# Patient Record
Sex: Female | Born: 1985
Health system: Southern US, Community
[De-identification: ages and names within clinical notes are randomized; demographics above are authoritative.]

## PROBLEM LIST (undated history)

## (undated) ENCOUNTER — Inpatient Hospital Stay (HOSPITAL_COMMUNITY): Payer: Self-pay

## (undated) HISTORY — PX: BREAST SURGERY: SHX581

---

## 2006-10-02 HISTORY — PX: WISDOM TOOTH EXTRACTION: SHX21

## 2013-11-21 ENCOUNTER — Emergency Department (HOSPITAL_COMMUNITY)
Admission: EM | Admit: 2013-11-21 | Discharge: 2013-11-21 | Disposition: A | Discharge status: Home / Self Care | Payer: 59 | Attending: Emergency Medicine | Admitting: Emergency Medicine

## 2013-11-21 ENCOUNTER — Encounter (HOSPITAL_COMMUNITY): Payer: Self-pay | Admitting: Emergency Medicine

## 2013-11-21 DIAGNOSIS — R209 Unspecified disturbances of skin sensation: Secondary | ICD-10-CM | POA: Insufficient documentation

## 2013-11-21 DIAGNOSIS — R1084 Generalized abdominal pain: Secondary | ICD-10-CM | POA: Insufficient documentation

## 2013-11-21 DIAGNOSIS — F458 Other somatoform disorders: Secondary | ICD-10-CM

## 2013-11-21 DIAGNOSIS — R112 Nausea with vomiting, unspecified: Secondary | ICD-10-CM | POA: Insufficient documentation

## 2013-11-21 DIAGNOSIS — Z3202 Encounter for pregnancy test, result negative: Secondary | ICD-10-CM | POA: Insufficient documentation

## 2013-11-21 DIAGNOSIS — R109 Unspecified abdominal pain: Secondary | ICD-10-CM

## 2013-11-21 DIAGNOSIS — M62838 Other muscle spasm: Secondary | ICD-10-CM | POA: Insufficient documentation

## 2013-11-21 DIAGNOSIS — G518 Other disorders of facial nerve: Secondary | ICD-10-CM | POA: Insufficient documentation

## 2013-11-21 DIAGNOSIS — F411 Generalized anxiety disorder: Secondary | ICD-10-CM | POA: Insufficient documentation

## 2013-11-21 DIAGNOSIS — R1110 Vomiting, unspecified: Secondary | ICD-10-CM

## 2013-11-21 LAB — COMPREHENSIVE METABOLIC PANEL
ALT: 16 U/L (ref 0–35)
AST: 18 U/L (ref 0–37)
Albumin: 3.7 g/dL (ref 3.5–5.2)
Alkaline Phosphatase: 46 U/L (ref 39–117)
BUN: 10 mg/dL (ref 6–23)
CALCIUM: 9.1 mg/dL (ref 8.4–10.5)
CO2: 17 mEq/L — ABNORMAL LOW (ref 19–32)
Chloride: 99 mEq/L (ref 96–112)
Creatinine, Ser: 0.74 mg/dL (ref 0.50–1.10)
GFR calc Af Amer: >90 mL/min (ref >90)
GFR calc non Af Amer: >90 mL/min (ref >90)
Glucose, Bld: 128 mg/dL — ABNORMAL HIGH (ref 70–99)
Potassium: 3.6 mEq/L — ABNORMAL LOW (ref 3.7–5.3)
Sodium: 137 mEq/L (ref 137–147)
TOTAL PROTEIN: 7.2 g/dL (ref 6.0–8.3)
Total Bilirubin: 1.1 mg/dL (ref 0.3–1.2)

## 2013-11-21 LAB — CBC WITH DIFFERENTIAL/PLATELET
BASOS ABS: 0 10*3/uL (ref 0.0–0.1)
BASOS PCT: 0 % (ref 0–1)
EOS ABS: 3.6 10*3/uL — AB (ref 0.0–0.7)
Eosinophils Relative: 26 % — ABNORMAL HIGH (ref 0–5)
HEMATOCRIT: 38.8 % (ref 36.0–46.0)
HEMOGLOBIN: 13.7 g/dL (ref 12.0–15.0)
Lymphocytes Relative: 30 % (ref 12–46)
Lymphs Abs: 4.2 10*3/uL — ABNORMAL HIGH (ref 0.7–4.0)
MCH: 31.7 pg (ref 26.0–34.0)
MCHC: 35.3 g/dL (ref 30.0–36.0)
MCV: 89.8 fL (ref 78.0–100.0)
MONO ABS: 0.7 10*3/uL (ref 0.1–1.0)
Monocytes Relative: 5 % (ref 3–12)
NEUTROS ABS: 5.4 10*3/uL (ref 1.7–7.7)
Neutrophils Relative %: 39 % — ABNORMAL LOW (ref 43–77)
Platelets: 370 10*3/uL (ref 150–400)
RBC: 4.32 MIL/uL (ref 3.87–5.11)
RDW: 12.9 % (ref 11.5–15.5)
WBC: 13.9 10*3/uL — ABNORMAL HIGH (ref 4.0–10.5)

## 2013-11-21 LAB — URINALYSIS, ROUTINE W REFLEX MICROSCOPIC
BILIRUBIN URINE: NEGATIVE
Glucose, UA: NEGATIVE mg/dL
Ketones, ur: 40 mg/dL — AB
LEUKOCYTES UA: NEGATIVE
NITRITE: NEGATIVE
Protein, ur: NEGATIVE mg/dL
SPECIFIC GRAVITY, URINE: 1.006 (ref 1.005–1.030)
UROBILINOGEN UA: 0.2 mg/dL (ref 0.0–1.0)
pH: 7.5 (ref 5.0–8.0)

## 2013-11-21 LAB — URINE MICROSCOPIC-ADD ON

## 2013-11-21 LAB — POC URINE PREG, ED: PREG TEST UR: NEGATIVE

## 2013-11-21 LAB — LIPASE, BLOOD: LIPASE: 40 U/L (ref 11–59)

## 2013-11-21 MED ORDER — SODIUM CHLORIDE 0.9 % IV BOLUS (SEPSIS)
1000.0000 mL | Freq: Once | INTRAVENOUS | Status: AC
  Administered 2013-11-21: 1000 mL via INTRAVENOUS

## 2013-11-21 MED ORDER — ONDANSETRON HCL 4 MG PO TABS: 4.0000 mg | ORAL_TABLET | Freq: Four times a day (QID) | ORAL | Status: DC

## 2013-11-21 MED ORDER — ONDANSETRON HCL 4 MG/2ML IJ SOLN
4.0000 mg | Freq: Once | INTRAMUSCULAR | Status: AC
  Administered 2013-11-21: 4 mg via INTRAVENOUS
  Filled 2013-11-21: qty 2

## 2013-11-21 MED ORDER — LORAZEPAM 2 MG/ML IJ SOLN
1.0000 mg | Freq: Once | INTRAMUSCULAR | Status: AC
  Administered 2013-11-21: 1 mg via INTRAVENOUS
  Filled 2013-11-21: qty 1

## 2013-11-21 NOTE — Discharge Instructions (Signed)
Abdominal Pain, Adult Many things can cause abdominal pain. Usually, abdominal pain is not caused by a disease and will improve without treatment. It can often be observed and treated at home. Your health care provider will do a physical exam and possibly order blood tests and X-rays to help determine the seriousness of your pain. However, in many cases, more time must pass before a clear cause of the pain can be found. Before that point, your health care provider may not know if you need more testing or further treatment. HOME CARE INSTRUCTIONS  Monitor your abdominal pain for any changes. The following actions may help to alleviate any discomfort you are experiencing:  Only take over-the-counter or prescription medicines as directed by your health care provider.  Do not take laxatives unless directed to do so by your health care provider.  Try a clear liquid diet (broth, tea, or water) as directed by your health care provider. Slowly move to a bland diet as tolerated. SEEK MEDICAL CARE IF:  You have unexplained abdominal pain.  You have abdominal pain associated with nausea or diarrhea.  You have pain when you urinate or have a bowel movement.  You experience abdominal pain that wakes you in the night.  You have abdominal pain that is worsened or improved by eating food.  You have abdominal pain that is worsened with eating fatty foods. SEEK IMMEDIATE MEDICAL CARE IF:   Your pain does not go away within 2 hours.  You have a fever.  You keep throwing up (vomiting).  Your pain is felt only in portions of the abdomen, such as the right side or the left lower portion of the abdomen.  You pass bloody or black tarry stools. MAKE SURE YOU:  Understand these instructions.   Will watch your condition.   Will get help right away if you are not doing well or get worse.  Document Released: 06/28/2005 Document Revised: 07/09/2013 Document Reviewed: 05/28/2013 [facility] Patient  Information 2014 [place].  Nausea and Vomiting Nausea is a sick feeling that often comes before throwing up (vomiting). Vomiting is a reflex where stomach contents come out of your mouth. Vomiting can cause severe loss of body fluids (dehydration). Children and elderly adults can become dehydrated quickly, especially if they also have diarrhea. Nausea and vomiting are symptoms of a condition or disease. It is important to find the cause of your symptoms. CAUSES   Direct irritation of the stomach lining. This irritation can result from increased acid production (gastroesophageal reflux disease), infection, food poisoning, taking certain medicines (such as nonsteroidal anti-inflammatory drugs), alcohol use, or tobacco use.  Signals from the brain.These signals could be caused by a headache, heat exposure, an inner ear disturbance, increased pressure in the brain from injury, infection, a tumor, or a concussion, pain, emotional stimulus, or metabolic problems.  An obstruction in the gastrointestinal tract (bowel obstruction).  Illnesses such as diabetes, hepatitis, gallbladder problems, appendicitis, kidney problems, cancer, sepsis, atypical symptoms of a heart attack, or eating disorders.  Medical treatments such as chemotherapy and radiation.  Receiving medicine that makes you sleep (general anesthetic) during surgery. DIAGNOSIS Your caregiver may ask for tests to be done if the problems do not improve after a few days. Tests may also be done if symptoms are severe or if the reason for the nausea and vomiting is not clear. Tests may include:  Urine tests.  Blood tests.  Stool tests.  Cultures (to look for evidence of infection).  X-rays or  other imaging studies. Test results can help your caregiver make decisions about treatment or the need for additional tests. TREATMENT You need to stay well hydrated. Drink frequently but in small amounts.You may wish to drink water, sports  drinks, clear broth, or eat frozen ice pops or gelatin dessert to help stay hydrated.When you eat, eating slowly may help prevent nausea.There are also some antinausea medicines that may help prevent nausea. HOME CARE INSTRUCTIONS   Take all medicine as directed by your caregiver.  If you do not have an appetite, do not force yourself to eat. However, you must continue to drink fluids.  If you have an appetite, eat a normal diet unless your caregiver tells you differently.  Eat a variety of complex carbohydrates (rice, wheat, potatoes, bread), lean meats, yogurt, fruits, and vegetables.  Avoid high-fat foods because they are more difficult to digest.  Drink enough water and fluids to keep your urine clear or pale yellow.  If you are dehydrated, ask your caregiver for specific rehydration instructions. Signs of dehydration may include:  Severe thirst.  Dry lips and mouth.  Dizziness.  Dark urine.  Decreasing urine frequency and amount.  Confusion.  Rapid breathing or pulse. SEEK IMMEDIATE MEDICAL CARE IF:   You have blood or brown flecks (like coffee grounds) in your vomit.  You have black or bloody stools.  You have a severe headache or stiff neck.  You are confused.  You have severe abdominal pain.  You have chest pain or trouble breathing.  You do not urinate at least once every 8 hours.  You develop cold or clammy skin.  You continue to vomit for longer than 24 to 48 hours.  You have a fever. MAKE SURE YOU:   Understand these instructions.  Will watch your condition.  Will get help right away if you are not doing well or get worse. Document Released: 09/18/2005 Document Revised: 12/11/2011 Document Reviewed: 02/15/2011 [facility] Patient Information 2014 [place], [place].  Hyperventilation Hyperventilation is breathing that is deeper and more rapid than normal. It is usually associated with panic and anxiety. Hyperventilation can make you feel  breathless. It is sometimes called overbreathing. Breathing out too much causes a decrease in the amount of carbon dioxide gas in the blood. This leads to tingling and numbness in the hands, feet, and around the mouth. If this continues, your fingers, hands, and toes may begin to spasm. Hyperventilation usually lasts 20 30 minutes and can be associated with other symptoms of panic and anxiety, including:   Chest pains or tightness.  A pounding or irregular, racing heartbeat (palpitations).  Dizziness.  Lightheadedness.  Dry mouth.  Weakness.  Confusion.  Sleep disturbance. CAUSES  Sudden onset (acute) hyperventilation is usually triggered by acute stress, anxiety, or emotional upset. Long-term (chronic) and recurring hyperventilation can occur with chronic lung problems, such emphysema or asthma. Other causes include:   Nervousness.  Stress.  Stimulant, drug, or alcohol use.  Lung disease.  Infections, such as pneumonia.  Heart problems.  Severe pain.  Waking from a bad dream.  Pregnancy.  Bleeding. HOME CARE INSTRUCTIONS  Learn and use breathing exercises that help you breathe from your diaphragm and abdomen.  Practice relaxation techniques to reduce stress, such as visualization, meditation, and muscle release.  During an attack, try breathing into a paper bag. This changes the carbon dioxide level and slows down breathing. SEEK IMMEDIATE MEDICAL CARE IF:  Your hyperventilation continues or gets worse. MAKE SURE YOU:  Understand these instructions.  Will watch your condition.  Will get help right away if you are not doing well or get worse. Document Released: 09/15/2000 Document Revised: 03/19/2012 Document Reviewed: 12/28/2011 [facility] Patient Information 2014 [place].

## 2013-11-21 NOTE — ED Notes (Signed)
Patient reports vomiting last night for the first time. She came to work feeling weak and fatigued. Patient reports vomiting again this morning. Her hands have cramped and her face started to cramp also. Patient's breathing was labored and quick upon arrival.

## 2013-11-21 NOTE — ED Notes (Signed)
MD at bedside. 

## 2013-11-21 NOTE — ED Provider Notes (Signed)
CSN: [id number]     Arrival date & time 11/21/13  0716 History   First MD Initiated Contact with Patient 11/21/13 0719     Chief Complaint  Patient presents with  . Emesis     (Consider location/radiation/quality/duration/timing/severity/associated sxs/prior Treatment) HPI Comments: Patient brought to the emergency department for evaluation of vomiting. Patient reports that she had been feeling over the night, multiple episodes of vomiting. She has some abdominal cramping as well. 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 She is brought to the ER by coworkers, extremely anxious and hyperventilating.  Patient is a 28 y.o. female presenting with vomiting.  Emesis Associated symptoms: abdominal pain     No past medical history on file. No past surgical history on file. No family history on file. History  Substance Use Topics  . Smoking status: Not on file  . Smokeless tobacco: Not on file  . Alcohol Use: Not on file   OB History   No data available     Review of Systems  Gastrointestinal: Positive for nausea, vomiting and abdominal pain.  Psychiatric/Behavioral: The patient is nervous/anxious.   All other systems reviewed and are negative.      Allergies  Review of patient's allergies indicates not on file.  Home Medications  No current outpatient prescriptions on file. BP 133/72  Pulse 131  Temp(Src) 98.1 F (36.7 C) (Oral)  Resp 24  SpO2 100% Physical Exam  Constitutional: She is oriented to person, place, and time. She appears well-developed and well-nourished. No distress.  HENT:  Head: Normocephalic and atraumatic.  Right Ear: Hearing normal.  Left Ear: Hearing normal.  Nose: Nose normal.  Mouth/Throat: Oropharynx is clear and moist and mucous membranes are normal.  Facial/jaw spasm  Eyes: Conjunctivae and EOM are normal. Pupils are equal, round, and  reactive to light.  Neck: Normal range of motion. Neck supple.  Cardiovascular: Regular rhythm, S1 normal and S2 normal.  Exam reveals no gallop and no friction rub.   No murmur heard. Pulmonary/Chest: Effort normal and breath sounds normal. No respiratory distress. She exhibits no tenderness.  Abdominal: Soft. Normal appearance and bowel sounds are normal. There is no hepatosplenomegaly. There is generalized tenderness. There is no rebound, no guarding, no tenderness at McBurney's point and negative Murphy's sign. No hernia.  Musculoskeletal: Normal range of motion.  Spasm of fingers and wrists  Neurological: She is alert and oriented to person, place, and time. She has normal strength. No cranial nerve deficit or sensory deficit. Coordination normal. GCS eye subscore is 4. GCS verbal subscore is 5. GCS motor subscore is 6.  Skin: Skin is warm, dry and intact. No rash noted. No cyanosis.  Psychiatric: She has a normal mood and affect. Her speech is normal and behavior is normal. Thought content normal.    ED Course  Procedures (including critical care time) Labs Review Labs Reviewed  CBC WITH DIFFERENTIAL  COMPREHENSIVE METABOLIC PANEL  LIPASE, BLOOD  URINALYSIS, ROUTINE W REFLEX MICROSCOPIC  POC URINE PREG, ED   Imaging Review No results found.  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 She was experiencing obvious hyperventilation syndrome with spasm of the facial muscles and hands. This resolved with Ativan. Patient was administered IV fluids  for her   She has not had any further nausea or vomiting. Heart rate is improved. She was never hypotensive. laboratories all within normal limits. Patient was rechecked, abdominal exam is benign, nontender. Symptoms most consistent with viral etiology. She will be discharged with symptomatic treatment. Continue oral hydration. Return if  symptoms worsen.   Orpah Greek, MD 11/21/13 1006

## 2014-06-24 ENCOUNTER — Other Ambulatory Visit: Payer: Self-pay | Admitting: Obstetrics and Gynecology

## 2014-06-24 DIAGNOSIS — N63 Unspecified lump in unspecified breast: Secondary | ICD-10-CM

## 2014-06-30 ENCOUNTER — Ambulatory Visit
Admission: RE | Admit: 2014-06-30 | Discharge: 2014-06-30 | Disposition: A | Discharge status: Home / Self Care | Payer: 59 | Source: Ambulatory Visit | Attending: Obstetrics and Gynecology | Admitting: Obstetrics and Gynecology

## 2014-06-30 DIAGNOSIS — N63 Unspecified lump in unspecified breast: Secondary | ICD-10-CM

## 2014-07-02 ENCOUNTER — Other Ambulatory Visit: Payer: Self-pay | Admitting: Obstetrics and Gynecology

## 2014-07-02 DIAGNOSIS — N6320 Unspecified lump in the left breast, unspecified quadrant: Secondary | ICD-10-CM

## 2014-07-06 ENCOUNTER — Other Ambulatory Visit: Payer: Self-pay | Admitting: Obstetrics and Gynecology

## 2014-07-06 DIAGNOSIS — N6320 Unspecified lump in the left breast, unspecified quadrant: Secondary | ICD-10-CM

## 2014-07-10 ENCOUNTER — Ambulatory Visit
Admission: RE | Admit: 2014-07-10 | Discharge: 2014-07-10 | Disposition: A | Discharge status: Home / Self Care | Payer: 59 | Source: Ambulatory Visit | Attending: Obstetrics and Gynecology | Admitting: Obstetrics and Gynecology

## 2014-07-10 DIAGNOSIS — N6320 Unspecified lump in the left breast, unspecified quadrant: Secondary | ICD-10-CM

## 2015-08-14 LAB — OB RESULTS CONSOLE RUBELLA ANTIBODY, IGM: Rubella: IMMUNE

## 2015-08-14 LAB — OB RESULTS CONSOLE HEPATITIS B SURFACE ANTIGEN: Hepatitis B Surface Ag: NEGATIVE

## 2015-08-14 LAB — OB RESULTS CONSOLE GC/CHLAMYDIA
CHLAMYDIA, DNA PROBE: NEGATIVE
Gonorrhea: NEGATIVE

## 2015-10-08 DIAGNOSIS — Z3A18 18 weeks gestation of pregnancy: Secondary | ICD-10-CM | POA: Diagnosis not present

## 2015-10-08 DIAGNOSIS — Z3401 Encounter for supervision of normal first pregnancy, first trimester: Secondary | ICD-10-CM | POA: Diagnosis not present

## 2015-10-08 DIAGNOSIS — Z3A19 19 weeks gestation of pregnancy: Secondary | ICD-10-CM | POA: Diagnosis not present

## 2015-10-08 DIAGNOSIS — Z36 Encounter for antenatal screening of mother: Secondary | ICD-10-CM | POA: Diagnosis not present

## 2015-12-09 DIAGNOSIS — Z36 Encounter for antenatal screening of mother: Secondary | ICD-10-CM | POA: Diagnosis not present

## 2015-12-09 DIAGNOSIS — Z23 Encounter for immunization: Secondary | ICD-10-CM | POA: Diagnosis not present

## 2015-12-09 DIAGNOSIS — Z3A27 27 weeks gestation of pregnancy: Secondary | ICD-10-CM | POA: Diagnosis not present

## 2015-12-09 DIAGNOSIS — Z3402 Encounter for supervision of normal first pregnancy, second trimester: Secondary | ICD-10-CM | POA: Diagnosis not present

## 2015-12-10 LAB — OB RESULTS CONSOLE HIV ANTIBODY (ROUTINE TESTING): HIV: NONREACTIVE

## 2015-12-10 LAB — OB RESULTS CONSOLE RPR: RPR: NONREACTIVE

## 2015-12-22 DIAGNOSIS — Z3403 Encounter for supervision of normal first pregnancy, third trimester: Secondary | ICD-10-CM | POA: Diagnosis not present

## 2015-12-22 DIAGNOSIS — Z3A29 29 weeks gestation of pregnancy: Secondary | ICD-10-CM | POA: Diagnosis not present

## 2016-01-04 DIAGNOSIS — Z3403 Encounter for supervision of normal first pregnancy, third trimester: Secondary | ICD-10-CM | POA: Diagnosis not present

## 2016-01-04 DIAGNOSIS — Z3A31 31 weeks gestation of pregnancy: Secondary | ICD-10-CM | POA: Diagnosis not present

## 2016-01-19 DIAGNOSIS — Z3A33 33 weeks gestation of pregnancy: Secondary | ICD-10-CM | POA: Diagnosis not present

## 2016-01-19 DIAGNOSIS — Z3403 Encounter for supervision of normal first pregnancy, third trimester: Secondary | ICD-10-CM | POA: Diagnosis not present

## 2016-02-04 DIAGNOSIS — Z3403 Encounter for supervision of normal first pregnancy, third trimester: Secondary | ICD-10-CM | POA: Diagnosis not present

## 2016-02-04 DIAGNOSIS — Z3A35 35 weeks gestation of pregnancy: Secondary | ICD-10-CM | POA: Diagnosis not present

## 2016-02-04 DIAGNOSIS — Z36 Encounter for antenatal screening of mother: Secondary | ICD-10-CM | POA: Diagnosis not present

## 2016-02-06 LAB — OB RESULTS CONSOLE GBS: GBS: POSITIVE

## 2016-02-10 DIAGNOSIS — Z3403 Encounter for supervision of normal first pregnancy, third trimester: Secondary | ICD-10-CM | POA: Diagnosis not present

## 2016-02-10 DIAGNOSIS — Z3A36 36 weeks gestation of pregnancy: Secondary | ICD-10-CM | POA: Diagnosis not present

## 2016-03-05 ENCOUNTER — Inpatient Hospital Stay (HOSPITAL_COMMUNITY)
Admission: AD | Admit: 2016-03-05 | Discharge: 2016-03-09 | DRG: 766 | Disposition: A | Discharge status: Home / Self Care | Payer: 59 | Source: Ambulatory Visit | Attending: Obstetrics and Gynecology | Admitting: Obstetrics and Gynecology

## 2016-03-05 ENCOUNTER — Encounter (HOSPITAL_COMMUNITY): Payer: Self-pay

## 2016-03-05 ENCOUNTER — Inpatient Hospital Stay (HOSPITAL_COMMUNITY): Payer: 59 | Admitting: Anesthesiology

## 2016-03-05 DIAGNOSIS — O324XX0 Maternal care for high head at term, not applicable or unspecified: Principal | ICD-10-CM | POA: Diagnosis present

## 2016-03-05 DIAGNOSIS — O99824 Streptococcus B carrier state complicating childbirth: Secondary | ICD-10-CM | POA: Diagnosis present

## 2016-03-05 DIAGNOSIS — Z3A39 39 weeks gestation of pregnancy: Secondary | ICD-10-CM | POA: Diagnosis not present

## 2016-03-05 DIAGNOSIS — Z3A40 40 weeks gestation of pregnancy: Secondary | ICD-10-CM | POA: Diagnosis not present

## 2016-03-05 LAB — CBC
HCT: 36.4 % (ref 36.0–46.0)
HEMOGLOBIN: 12.4 g/dL (ref 12.0–15.0)
MCH: 29.6 pg (ref 26.0–34.0)
MCHC: 34.1 g/dL (ref 30.0–36.0)
MCV: 86.9 fL (ref 78.0–100.0)
Platelets: 356 10*3/uL (ref 150–400)
RBC: 4.19 MIL/uL (ref 3.87–5.11)
RDW: 14.3 % (ref 11.5–15.5)
WBC: 14.6 10*3/uL — ABNORMAL HIGH (ref 4.0–10.5)

## 2016-03-05 LAB — TYPE AND SCREEN
ABO/RH(D): O POS
Antibody Screen: NEGATIVE

## 2016-03-05 LAB — RPR: RPR: NONREACTIVE

## 2016-03-05 LAB — ABO/RH: ABO/RH(D): O POS

## 2016-03-05 MED ORDER — PHENYLEPHRINE 40 MCG/ML (10ML) SYRINGE FOR IV PUSH (FOR BLOOD PRESSURE SUPPORT): 80.0000 ug | PREFILLED_SYRINGE | INTRAVENOUS | Status: DC | PRN

## 2016-03-05 MED ORDER — LACTATED RINGERS IV SOLN
500.0000 mL | INTRAVENOUS | Status: DC | PRN
  Administered 2016-03-05: 500 mL via INTRAVENOUS

## 2016-03-05 MED ORDER — FLEET ENEMA 7-19 GM/118ML RE ENEM: 1.0000 | ENEMA | RECTAL | Status: DC | PRN

## 2016-03-05 MED ORDER — ACETAMINOPHEN 325 MG PO TABS: 650.0000 mg | ORAL_TABLET | ORAL | Status: DC | PRN

## 2016-03-05 MED ORDER — PENICILLIN G POTASSIUM 5000000 UNITS IJ SOLR
5.0000 10*6.[IU] | Freq: Once | INTRAVENOUS | Status: AC
  Administered 2016-03-05: 5 10*6.[IU] via INTRAVENOUS
  Filled 2016-03-05: qty 5

## 2016-03-05 MED ORDER — EPHEDRINE 5 MG/ML INJ: 10.0000 mg | INTRAVENOUS | Status: DC | PRN

## 2016-03-05 MED ORDER — LACTATED RINGERS IV SOLN: 500.0000 mL | Freq: Once | INTRAVENOUS | Status: DC

## 2016-03-05 MED ORDER — LIDOCAINE HCL (PF) 1 % IJ SOLN
INTRAMUSCULAR | Status: DC | PRN
  Administered 2016-03-05 (×2): 5 mL via EPIDURAL

## 2016-03-05 MED ORDER — LIDOCAINE HCL (PF) 1 % IJ SOLN
30.0000 mL | INTRAMUSCULAR | Status: DC | PRN
  Filled 2016-03-05: qty 30

## 2016-03-05 MED ORDER — OXYCODONE-ACETAMINOPHEN 5-325 MG PO TABS: 1.0000 | ORAL_TABLET | ORAL | Status: DC | PRN

## 2016-03-05 MED ORDER — OXYTOCIN 40 UNITS IN LACTATED RINGERS INFUSION - SIMPLE MED
1.0000 m[IU]/min | INTRAVENOUS | Status: DC
  Administered 2016-03-05: 1 m[IU]/min via INTRAVENOUS

## 2016-03-05 MED ORDER — FENTANYL 2.5 MCG/ML BUPIVACAINE 1/10 % EPIDURAL INFUSION (WH - ANES)
INTRAMUSCULAR | Status: AC
  Filled 2016-03-05: qty 125

## 2016-03-05 MED ORDER — PHENYLEPHRINE 40 MCG/ML (10ML) SYRINGE FOR IV PUSH (FOR BLOOD PRESSURE SUPPORT)
PREFILLED_SYRINGE | INTRAVENOUS | Status: AC
  Filled 2016-03-05: qty 10

## 2016-03-05 MED ORDER — OXYTOCIN BOLUS FROM INFUSION: 500.0000 mL | INTRAVENOUS | Status: DC

## 2016-03-05 MED ORDER — TERBUTALINE SULFATE 1 MG/ML IJ SOLN: 0.2500 mg | Freq: Once | INTRAMUSCULAR | Status: DC | PRN

## 2016-03-05 MED ORDER — OXYCODONE-ACETAMINOPHEN 5-325 MG PO TABS: 2.0000 | ORAL_TABLET | ORAL | Status: DC | PRN

## 2016-03-05 MED ORDER — LACTATED RINGERS IV SOLN
INTRAVENOUS | Status: DC
  Administered 2016-03-05 (×2): via INTRAVENOUS

## 2016-03-05 MED ORDER — DIPHENHYDRAMINE HCL 50 MG/ML IJ SOLN: 12.5000 mg | INTRAMUSCULAR | Status: DC | PRN

## 2016-03-05 MED ORDER — ONDANSETRON HCL 4 MG/2ML IJ SOLN: 4.0000 mg | Freq: Four times a day (QID) | INTRAMUSCULAR | Status: DC | PRN

## 2016-03-05 MED ORDER — PENICILLIN G POTASSIUM 5000000 UNITS IJ SOLR
2.5000 10*6.[IU] | INTRAVENOUS | Status: DC
  Administered 2016-03-05 (×4): 2.5 10*6.[IU] via INTRAVENOUS
  Filled 2016-03-05 (×8): qty 2.5

## 2016-03-05 MED ORDER — OXYTOCIN 40 UNITS IN LACTATED RINGERS INFUSION - SIMPLE MED
2.5000 [IU]/h | INTRAVENOUS | Status: DC
  Filled 2016-03-05: qty 1000

## 2016-03-05 MED ORDER — FENTANYL 2.5 MCG/ML BUPIVACAINE 1/10 % EPIDURAL INFUSION (WH - ANES)
14.0000 mL/h | INTRAMUSCULAR | Status: DC | PRN
  Administered 2016-03-05 (×2): 14 mL/h via EPIDURAL

## 2016-03-05 MED ORDER — BUTORPHANOL TARTRATE 1 MG/ML IJ SOLN: 1.0000 mg | INTRAMUSCULAR | Status: DC | PRN

## 2016-03-05 MED ORDER — SOD CITRATE-CITRIC ACID 500-334 MG/5ML PO SOLN
30.0000 mL | ORAL | Status: DC | PRN
  Administered 2016-03-06: 30 mL via ORAL
  Filled 2016-03-05: qty 15

## 2016-03-05 NOTE — MAU Note (Signed)
Pt c/o contractions since midnight. Now feeling every 3-6 mins. Denies LOF or vag bleeding. +FM

## 2016-03-05 NOTE — Progress Notes (Signed)
Comfortable with epidural Afeb, VSS FHT- 120s, min-mod variability, + scalp stim, has had a few probable late decels with epidural placement VE-C/C/+1 Will let her rest until 2100 then start pushing

## 2016-03-05 NOTE — Anesthesia Procedure Notes (Signed)
Epidural Patient location during procedure: OB Start time: 03/05/2016 7:40 PM End time: 03/05/2016 7:45 PM  Staffing Anesthesiologist: Rod Mae Performed by: anesthesiologist   Preanesthetic Checklist Completed: patient identified, site marked, surgical consent, pre-op evaluation, timeout performed, IV checked, risks and benefits discussed and monitors and equipment checked  Epidural Patient position: sitting Prep: site prepped and draped and DuraPrep Patient monitoring: continuous pulse ox and blood pressure Approach: midline Location: L3-L4 Injection technique: LOR air  Needle:  Needle type: Tuohy  Needle gauge: 17 G Needle length: 9 cm and 9 Needle insertion depth: 5 cm cm Catheter type: closed end flexible Catheter size: 19 Gauge Catheter at skin depth: 10 cm Test dose: negative  Assessment Sensory level: T10 Events: blood not aspirated, injection not painful, no injection resistance, negative IV test and no paresthesia  Additional Notes Patient identified. Risks/Benefits/Options discussed with patient including but not limited to bleeding, infection, nerve damage, paralysis, failed block, incomplete pain control, headache, blood pressure changes, nausea, vomiting, reactions to medication both or allergic, itching and postpartum back pain. Confirmed with bedside nurse the patient's most recent platelet count. Confirmed with patient that they are not currently taking any anticoagulation, have any bleeding history or any family history of bleeding disorders. Patient expressed understanding and wished to proceed. All questions were answered. Sterile technique was used throughout the entire procedure. Please see nursing notes for vital signs. Test dose was given through epidural catheter and negative prior to continuing to dose epidural or start infusion. Warning signs of high block given to the patient including shortness of breath, tingling/numbness in hands, complete motor  block, or any concerning symptoms with instructions to call for help. Patient was given instructions on fall risk and not to get out of bed. All questions and concerns addressed with instructions to call with any issues or inadequate analgesia.

## 2016-03-05 NOTE — Progress Notes (Signed)
Pt very uncomfortable, she has chosen to labor without pain medication or intervention.  She has SROM at about 1500 Afeb, VSS FHT- 110s, mod variability, + accels, rare variable decel and some ? Prolonged decels vs temporary change in baseline to 100s, ctx q 2-3 minutes VE-per RN 9.5, C/0, cervix is becoming swollen, no significant change in 4-5 hours Discussed her protracted labor, discussed options.  She will get an epidural, I will then examine her and we will discuss augmentation with pitocin vs c-section

## 2016-03-05 NOTE — H&P (Signed)
Jessica Schwartz is a 30 y.o. female, G1P0, EGA 39+ weeks with EDC 6-5 presenting for ctx.  On eval in MAU, reg ular ctx, VE 4 cm.  Prenatal care essentially uncomplicated.  Maternal Medical History:  Reason for admission: Contractions.   Contractions: Frequency: regular.   Perceived severity is strong.    Fetal activity: Perceived fetal activity is normal.    Prenatal complications: no prenatal complications   OB History    Gravida Para Term Preterm AB TAB SAB Ectopic Multiple Living   1              History reviewed. No pertinent past medical history. Past Surgical History  Procedure Laterality Date  . Wisdom tooth extraction Bilateral 2008   Family History: family history is not on file. Social History:  reports that she has never smoked. She does not have any smokeless tobacco history on file. She reports that she does not drink alcohol or use illicit drugs.   Prenatal Transfer Tool  Maternal Diabetes: No Genetic Screening: Normal Maternal Ultrasounds/Referrals: Normal Fetal Ultrasounds or other Referrals:  None Maternal Substance Abuse:  No Significant Maternal Medications:  None Significant Maternal Lab Results:  Lab values include: Group B Strep positive Other Comments:  None  Review of Systems  Respiratory: Negative.   Cardiovascular: Negative.     Dilation: 4 Effacement (%): 80, 90 Station: -1 Exam by:: Maryagnes Amos RN Blood pressure 123/81, pulse 101, temperature 98.3 F (36.8 C), temperature source Oral, resp. rate 18, height 5\' 4"  (1.626 m), weight 72.576 kg (160 lb), SpO2 98 %. Maternal Exam:  Uterine Assessment: Contraction strength is firm.  Contraction frequency is regular.   Abdomen: Patient reports no abdominal tenderness. Estimated fetal weight is 7 1/2 lbs.   Fetal presentation: vertex  Introitus: Normal vulva. Normal vagina.  Amniotic fluid character: not assessed.  Pelvis: adequate for delivery.   Cervix: Cervix evaluated by digital  exam.     Fetal Exam Fetal Monitor Review: Mode: ultrasound.   Variability: moderate (6-25 bpm).   Pattern: accelerations present and no decelerations.    Fetal State Assessment: Category I - tracings are normal.     Physical Exam  Vitals reviewed. Constitutional: She appears well-developed and well-nourished.  Cardiovascular: Normal rate, regular rhythm and normal heart sounds.   No murmur heard. Respiratory: Effort normal and breath sounds normal. No respiratory distress. She has no wheezes.  GI: Soft.    Prenatal labs: ABO, Rh: --/--/O POS (06/04 [id number]) Antibody: NEG (06/04 [id number]) Rubella:  Immune RPR:   NR HBsAg:   Neg HIV:  NR  GBS: Positive (05/07 0000)   Assessment/Plan: IUP at 39+ weeks in early labor, +GBS.  Will admit, monitor progress, PCN for +GBS.   Orrie Schubert D 03/05/2016, 9:09 AM

## 2016-03-05 NOTE — Progress Notes (Signed)
Has been pushing for about 90 minutes Afeb, VSS FHT- 120s, mod variability, + accels, variable decels with some ctx, ctx q 2-3 min VE-C/C/+2 Continue pushing, continue PCN

## 2016-03-05 NOTE — Anesthesia Preprocedure Evaluation (Signed)
Anesthesia Evaluation  Patient identified by MRN, date of birth, ID band Patient awake    Reviewed: Allergy & Precautions, H&P , NPO status , Patient's Chart, lab work & pertinent test results  Airway Mallampati: II  TM Distance: >3 FB Neck ROM: full    Dental no notable dental hx. (+) Dental Advisory Given, Teeth Intact   Pulmonary neg pulmonary ROS,    Pulmonary exam normal breath sounds clear to auscultation       Cardiovascular Exercise Tolerance: Good negative cardio ROS Normal cardiovascular exam Rhythm:regular Rate:Normal     Neuro/Psych negative neurological ROS  negative psych ROS   GI/Hepatic negative GI ROS, Neg liver ROS,   Endo/Other  negative endocrine ROS  Renal/GU negative Renal ROS  negative genitourinary   Musculoskeletal   Abdominal   Peds  Hematology negative hematology ROS (+)   Anesthesia Other Findings   Reproductive/Obstetrics negative OB ROS (+) Pregnancy                             Anesthesia Physical Anesthesia Plan  ASA: II  Anesthesia Plan: Epidural   Post-op Pain Management:    Induction:   Airway Management Planned:   Additional Equipment:   Intra-op Plan:   Post-operative Plan:   Informed Consent: I have reviewed the patients History and Physical, chart, labs and discussed the procedure including the risks, benefits and alternatives for the proposed anesthesia with the patient or authorized representative who has indicated his/her understanding and acceptance.   Dental Advisory Given  Plan Discussed with: CRNA and Surgeon  Anesthesia Plan Comments:         Anesthesia Quick Evaluation

## 2016-03-06 ENCOUNTER — Encounter (HOSPITAL_COMMUNITY): Payer: Self-pay | Admitting: *Deleted

## 2016-03-06 ENCOUNTER — Encounter (HOSPITAL_COMMUNITY)
Admission: AD | Disposition: A | Discharge status: Home / Self Care | Payer: Self-pay | Source: Ambulatory Visit | Attending: Obstetrics and Gynecology

## 2016-03-06 DIAGNOSIS — O324XX0 Maternal care for high head at term, not applicable or unspecified: Secondary | ICD-10-CM | POA: Diagnosis not present

## 2016-03-06 DIAGNOSIS — O99824 Streptococcus B carrier state complicating childbirth: Secondary | ICD-10-CM | POA: Diagnosis not present

## 2016-03-06 DIAGNOSIS — Z3A39 39 weeks gestation of pregnancy: Secondary | ICD-10-CM | POA: Diagnosis not present

## 2016-03-06 SURGERY — Surgical Case
Anesthesia: Epidural | Site: Abdomen

## 2016-03-06 MED ORDER — ACETAMINOPHEN FOR CIRCUMCISION 160 MG/5 ML: 40.0000 mg | ORAL | Status: DC | PRN

## 2016-03-06 MED ORDER — LACTATED RINGERS IV SOLN
INTRAVENOUS | Status: DC | PRN
  Administered 2016-03-06: 01:00:00 via INTRAVENOUS

## 2016-03-06 MED ORDER — OXYTOCIN 10 UNIT/ML IJ SOLN
INTRAMUSCULAR | Status: AC
  Filled 2016-03-06: qty 4

## 2016-03-06 MED ORDER — OXYTOCIN 40 UNITS IN LACTATED RINGERS INFUSION - SIMPLE MED: 2.5000 [IU]/h | INTRAVENOUS | Status: AC

## 2016-03-06 MED ORDER — LIDOCAINE-EPINEPHRINE (PF) 2 %-1:200000 IJ SOLN
INTRAMUSCULAR | Status: AC
  Filled 2016-03-06: qty 20

## 2016-03-06 MED ORDER — SODIUM BICARBONATE 8.4 % IV SOLN
INTRAVENOUS | Status: DC | PRN
  Administered 2016-03-06: 5 mL via EPIDURAL
  Administered 2016-03-06: 6 mL via EPIDURAL
  Administered 2016-03-06: 5 mL via EPIDURAL

## 2016-03-06 MED ORDER — SODIUM BICARBONATE 8.4 % IV SOLN
INTRAVENOUS | Status: AC
  Filled 2016-03-06: qty 50

## 2016-03-06 MED ORDER — SENNOSIDES-DOCUSATE SODIUM 8.6-50 MG PO TABS
2.0000 | ORAL_TABLET | ORAL | Status: DC
  Administered 2016-03-06 – 2016-03-08 (×3): 2 via ORAL
  Filled 2016-03-06 (×3): qty 2

## 2016-03-06 MED ORDER — DIBUCAINE 1 % RE OINT: 1.0000 "application " | TOPICAL_OINTMENT | RECTAL | Status: DC | PRN

## 2016-03-06 MED ORDER — ACETAMINOPHEN 325 MG PO TABS
650.0000 mg | ORAL_TABLET | ORAL | Status: DC | PRN
  Administered 2016-03-07 – 2016-03-09 (×8): 650 mg via ORAL
  Filled 2016-03-06 (×8): qty 2

## 2016-03-06 MED ORDER — LACTATED RINGERS IV SOLN
INTRAVENOUS | Status: DC
  Administered 2016-03-06: 125 mL/h via INTRAVENOUS

## 2016-03-06 MED ORDER — MENTHOL 3 MG MT LOZG: 1.0000 | LOZENGE | OROMUCOSAL | Status: DC | PRN

## 2016-03-06 MED ORDER — FENTANYL CITRATE (PF) 100 MCG/2ML IJ SOLN: 25.0000 ug | INTRAMUSCULAR | Status: DC | PRN

## 2016-03-06 MED ORDER — LACTATED RINGERS IV SOLN
INTRAVENOUS | Status: DC | PRN
  Administered 2016-03-06 (×2): via INTRAVENOUS

## 2016-03-06 MED ORDER — EPINEPHRINE TOPICAL FOR CIRCUMCISION 0.1 MG/ML: 1.0000 [drp] | TOPICAL | Status: DC | PRN

## 2016-03-06 MED ORDER — SUCROSE 24% NICU/PEDS ORAL SOLUTION
0.5000 mL | OROMUCOSAL | Status: DC | PRN
  Filled 2016-03-06: qty 0.5

## 2016-03-06 MED ORDER — LACTATED RINGERS IV SOLN: INTRAVENOUS | Status: DC

## 2016-03-06 MED ORDER — MAGNESIUM HYDROXIDE 400 MG/5ML PO SUSP: 30.0000 mL | ORAL | Status: DC | PRN

## 2016-03-06 MED ORDER — ONDANSETRON HCL 4 MG/2ML IJ SOLN
INTRAMUSCULAR | Status: AC
  Filled 2016-03-06: qty 2

## 2016-03-06 MED ORDER — WITCH HAZEL-GLYCERIN EX PADS: 1.0000 "application " | MEDICATED_PAD | CUTANEOUS | Status: DC | PRN

## 2016-03-06 MED ORDER — CEFAZOLIN SODIUM-DEXTROSE 2-4 GM/100ML-% IV SOLN
INTRAVENOUS | Status: AC
  Filled 2016-03-06: qty 100

## 2016-03-06 MED ORDER — COCONUT OIL OIL
1.0000 | TOPICAL_OIL | Status: DC | PRN
Start: 2016-03-06 — End: 2016-03-09
  Administered 2016-03-07: 1 via TOPICAL
  Filled 2016-03-06: qty 120

## 2016-03-06 MED ORDER — MORPHINE SULFATE (PF) 0.5 MG/ML IJ SOLN
INTRAMUSCULAR | Status: AC
  Filled 2016-03-06: qty 10

## 2016-03-06 MED ORDER — OXYCODONE HCL 5 MG PO TABS
5.0000 mg | ORAL_TABLET | ORAL | Status: DC | PRN
  Administered 2016-03-07 – 2016-03-09 (×7): 5 mg via ORAL
  Filled 2016-03-06 (×7): qty 1

## 2016-03-06 MED ORDER — SIMETHICONE 80 MG PO CHEW
80.0000 mg | CHEWABLE_TABLET | ORAL | Status: DC | PRN
  Administered 2016-03-06 – 2016-03-08 (×4): 80 mg via ORAL
  Filled 2016-03-06 (×4): qty 1

## 2016-03-06 MED ORDER — OXYTOCIN 10 UNIT/ML IJ SOLN
40.0000 [IU] | INTRAVENOUS | Status: DC | PRN
  Administered 2016-03-06: 40 [IU] via INTRAVENOUS

## 2016-03-06 MED ORDER — OXYCODONE HCL 5 MG PO TABS: 10.0000 mg | ORAL_TABLET | ORAL | Status: DC | PRN

## 2016-03-06 MED ORDER — DIPHENHYDRAMINE HCL 25 MG PO CAPS: 25.0000 mg | ORAL_CAPSULE | Freq: Four times a day (QID) | ORAL | Status: DC | PRN

## 2016-03-06 MED ORDER — CEFAZOLIN SODIUM-DEXTROSE 2-3 GM-% IV SOLR
INTRAVENOUS | Status: DC | PRN
  Administered 2016-03-06: 2 g via INTRAVENOUS

## 2016-03-06 MED ORDER — IBUPROFEN 600 MG PO TABS
600.0000 mg | ORAL_TABLET | Freq: Four times a day (QID) | ORAL | Status: DC
  Administered 2016-03-06 – 2016-03-09 (×13): 600 mg via ORAL
  Filled 2016-03-06 (×13): qty 1

## 2016-03-06 MED ORDER — SODIUM CHLORIDE 0.9 % IR SOLN
Status: DC | PRN
  Administered 2016-03-06: 1000 mL

## 2016-03-06 MED ORDER — PRENATAL MULTIVITAMIN CH
1.0000 | ORAL_TABLET | Freq: Every day | ORAL | Status: DC
  Administered 2016-03-06 – 2016-03-08 (×3): 1 via ORAL
  Filled 2016-03-06 (×3): qty 1

## 2016-03-06 MED ORDER — ACETAMINOPHEN FOR CIRCUMCISION 160 MG/5 ML: 40.0000 mg | Freq: Once | ORAL | Status: DC

## 2016-03-06 MED ORDER — ONDANSETRON HCL 4 MG/2ML IJ SOLN
INTRAMUSCULAR | Status: DC | PRN
  Administered 2016-03-06: 4 mg via INTRAVENOUS

## 2016-03-06 MED ORDER — LIDOCAINE 1% INJECTION FOR CIRCUMCISION
0.8000 mL | INJECTION | Freq: Once | INTRAVENOUS | Status: DC
  Filled 2016-03-06: qty 1

## 2016-03-06 MED ORDER — MEASLES, MUMPS & RUBELLA VAC ~~LOC~~ INJ
0.5000 mL | INJECTION | Freq: Once | SUBCUTANEOUS | Status: DC
  Filled 2016-03-06: qty 0.5

## 2016-03-06 MED ORDER — MORPHINE SULFATE (PF) 0.5 MG/ML IJ SOLN
INTRAMUSCULAR | Status: DC | PRN
  Administered 2016-03-06: 4 mg via EPIDURAL

## 2016-03-06 MED ORDER — SCOPOLAMINE 1 MG/3DAYS TD PT72
MEDICATED_PATCH | TRANSDERMAL | Status: DC | PRN
  Administered 2016-03-06: 1 via TRANSDERMAL

## 2016-03-06 MED ORDER — TETANUS-DIPHTH-ACELL PERTUSSIS 5-2.5-18.5 LF-MCG/0.5 IM SUSP: 0.5000 mL | Freq: Once | INTRAMUSCULAR | Status: DC

## 2016-03-06 MED ORDER — ZOLPIDEM TARTRATE 5 MG PO TABS: 5.0000 mg | ORAL_TABLET | Freq: Every evening | ORAL | Status: DC | PRN

## 2016-03-06 MED ORDER — DEXAMETHASONE SODIUM PHOSPHATE 10 MG/ML IJ SOLN
INTRAMUSCULAR | Status: DC | PRN
  Administered 2016-03-06: 10 mg via INTRAVENOUS

## 2016-03-06 SURGICAL SUPPLY — 33 items
BENZOIN TINCTURE PRP APPL 2/3 (GAUZE/BANDAGES/DRESSINGS) ×2 IMPLANT
CLAMP CORD UMBIL (MISCELLANEOUS) IMPLANT
CLOTH BEACON ORANGE TIMEOUT ST (SAFETY) ×2 IMPLANT
CONTAINER PREFILL 10% NBF 15ML (MISCELLANEOUS) IMPLANT
DRSG OPSITE POSTOP 4X10 (GAUZE/BANDAGES/DRESSINGS) ×2 IMPLANT
DURAPREP 26ML APPLICATOR (WOUND CARE) ×2 IMPLANT
ELECT REM PT RETURN 9FT ADLT (ELECTROSURGICAL) ×2
ELECTRODE REM PT RTRN 9FT ADLT (ELECTROSURGICAL) ×1 IMPLANT
EXTRACTOR VACUUM KIWI (MISCELLANEOUS) IMPLANT
EXTRACTOR VACUUM M CUP 4 TUBE (SUCTIONS) IMPLANT
GLOVE BIOGEL PI IND STRL 7.0 (GLOVE) ×1 IMPLANT
GLOVE BIOGEL PI INDICATOR 7.0 (GLOVE) ×1
GLOVE ORTHO TXT STRL SZ7.5 (GLOVE) ×2 IMPLANT
GOWN STRL REUS W/TWL LRG LVL3 (GOWN DISPOSABLE) ×4 IMPLANT
KIT ABG SYR 3ML LUER SLIP (SYRINGE) IMPLANT
NEEDLE HYPO 25X5/8 SAFETYGLIDE (NEEDLE) ×2 IMPLANT
NS IRRIG 1000ML POUR BTL (IV SOLUTION) ×2 IMPLANT
PACK C SECTION WH (CUSTOM PROCEDURE TRAY) ×2 IMPLANT
PAD OB MATERNITY 4.3X12.25 (PERSONAL CARE ITEMS) ×2 IMPLANT
PENCIL SMOKE EVAC W/HOLSTER (ELECTROSURGICAL) ×2 IMPLANT
RTRCTR C-SECT PINK 25CM LRG (MISCELLANEOUS) ×2 IMPLANT
STRIP CLOSURE SKIN 1/2X4 (GAUZE/BANDAGES/DRESSINGS) ×2 IMPLANT
SUT CHROMIC 1 CTX 36 (SUTURE) ×4 IMPLANT
SUT PLAIN 0 NONE (SUTURE) IMPLANT
SUT PLAIN 2 0 XLH (SUTURE) IMPLANT
SUT VIC AB 0 CT1 27 (SUTURE) ×2
SUT VIC AB 0 CT1 27XBRD ANBCTR (SUTURE) ×2 IMPLANT
SUT VIC AB 2-0 CT1 (SUTURE) ×2 IMPLANT
SUT VIC AB 2-0 CT1 27 (SUTURE) ×1
SUT VIC AB 2-0 CT1 TAPERPNT 27 (SUTURE) ×1 IMPLANT
SUT VIC AB 4-0 KS 27 (SUTURE) ×2 IMPLANT
TOWEL OR 17X24 6PK STRL BLUE (TOWEL DISPOSABLE) ×2 IMPLANT
TRAY FOLEY CATH SILVER 14FR (SET/KITS/TRAYS/PACK) ×2 IMPLANT

## 2016-03-06 NOTE — Anesthesia Postprocedure Evaluation (Signed)
Anesthesia Post Note  Patient: Jessica Schwartz  Procedure(s) Performed: Procedure(s) (LRB): CESAREAN SECTION (N/A)  Patient location during evaluation: Mother Baby Anesthesia Type: Epidural Level of consciousness: awake Pain management: satisfactory to patient Vital Signs Assessment: post-procedure vital signs reviewed and stable Respiratory status: spontaneous breathing Cardiovascular status: stable Anesthetic complications: no     Last Vitals:  Filed Vitals:   03/06/16 0630 03/06/16 1030  BP:    Pulse:    Temp: 36.7 C 36.8 C  Resp: 18 16    Last Pain:  Filed Vitals:   03/06/16 1344  PainSc: 1    Pain Goal:                 [employer]

## 2016-03-06 NOTE — Lactation Note (Signed)
This note was copied from a baby's chart. Lactation Consultation Note  Patient Name: Jessica Schwartz [id number] Date: 03/06/2016 Reason for consult: Follow-up assessment Baby at 20 hr of life and mom was requesting help with latch. She was holding baby is a semi football hold on pillows that were too low and too far away. Had mom raise baby to breast level and pull him in close. She has an erect R nipple that is tucked into the base of the areola, semi erect nipple. The skin at the junction of the nipple shaft and areola looks like a pink ring when baby comes off the breast. the L nipple is more inverted than the R.  Mom is reporting bilateral soreness. Discussed baby behavior, feeding frequency, baby belly size, voids, wt loss, breast changes, and nipple care. She can manually express and has been spoon feeding baby when he was not latching well. She is aware of lactation services and support group. She is currently not using the NS, Harmony, or shells. She will latch baby on demand 8+/24hr and manually express to spoon feed as needed.       Maternal Data    Feeding Feeding Type: Breast Fed Length of feed: 15 min  LATCH Score/Interventions Latch: Repeated attempts needed to sustain latch, nipple held in mouth throughout feeding, stimulation needed to elicit sucking reflex. Intervention(s): Skin to skin;Waking techniques;Teach feeding cues Intervention(s): Adjust position;Assist with latch;Breast massage;Breast compression  Audible Swallowing: Spontaneous and intermittent Intervention(s): Hand expression Intervention(s): Alternate breast massage  Type of Nipple: Everted at rest and after stimulation  Comfort (Breast/Nipple): Filling, red/small blisters or bruises, mild/mod discomfort  Problem noted: Mild/Moderate discomfort Interventions (Mild/moderate discomfort): Hand expression  Hold (Positioning): Assistance needed to correctly position infant at breast and maintain  latch. Intervention(s): Position options;Support Pillows  LATCH Score: 7  Lactation Tools Discussed/Used     Consult Status Consult Status: Follow-up Date: 03/07/16 Follow-up type: In-patient    Denzil Hughes 03/06/2016, 9:04 PM

## 2016-03-06 NOTE — Op Note (Signed)
Preoperative diagnosis: Intrauterine pregnancy at 40 weeks, arrest of descent Postoperative diagnosis: Same Procedure: Primary low transverse cesarean section without extensions Surgeon: Cheri Fowler M.D. Anesthesia: Epidural  Findings: Patient had normal gravid anatomy and delivered a viable female infant with Apgars of 9 and 9 weight pending Estimated blood loss: 800 cc Specimens: Placenta sent to labor and delivery Complications: None  Procedure in detail: The patient was taken to the operating room and placed in the dorsosupine position. Her previously placed epidural was dosed appropriately.  Abdomen was then prepped and draped in the usual sterile fashion, a foley catheter had previously been inserted. The level of her anesthesia was found to be adequate. Abdomen was entered via a standard Pfannenstiel incision. Once the peritoneal cavity was entered the Alexis disposable self-retaining retractor was placed and good visualization was achieved. A 4 cm transverse incision was then made in the lower uterine segment pushing the bladder inferior. Once the uterine cavity was entered the incision was extended digitally. The fetal vertex was grasped and delivered through the incision atraumatically. Mouth and nares were suctioned. The remainder of the infant then delivered atraumatically. Cord was doubly clamped and cut and the infant handed to the awaiting pediatric team. Cord blood was obtained. The placenta delivered spontaneously. Uterus was wiped dry with clean lap pad and all clots and debris were removed. Uterine incision was inspected and found to be free of extensions. Uterine incision was closed in 2 layers with running locking #1 Chromic for the first layer, an imbricating layer of #1 Chromic for the second layer. Tubes and ovaries were inspected and found to be normal. Uterine incision was inspected and found to be hemostatic. Bleeding from serosal edges was controlled with electrocautery. The  Alexis retractor was removed. Subfascial space was irrigated and made hemostatic with electrocautery. Peritoneum was closed with 2-0 Vicryl.  Fascia was closed in running fashion starting at both ends and meeting in the middle with 0 Vicryl. Subcutaneous tissue was then irrigated and made hemostatic with electrocautery. Skin was closed with running 4-0 Vicryl subcuticular suture followed by steri-strips and a sterile dressing. Patient tolerated the procedure well and was taken to the recovery in stable condition. Counts were correct x2, she received Ancef 2 g IV at the beginning of the procedure and she had PAS hose on throughout the procedure.

## 2016-03-06 NOTE — Anesthesia Postprocedure Evaluation (Signed)
Anesthesia Post Note  Patient: Jessica Schwartz  Procedure(s) Performed: Procedure(s) (LRB): CESAREAN SECTION (N/A)  Patient location during evaluation: PACU Anesthesia Type: Epidural Level of consciousness: awake and alert Pain management: pain level controlled Vital Signs Assessment: post-procedure vital signs reviewed and stable Respiratory status: spontaneous breathing, nonlabored ventilation, respiratory function stable and patient connected to nasal cannula oxygen Cardiovascular status: blood pressure returned to baseline and stable Postop Assessment: no signs of nausea or vomiting Anesthetic complications: no     Last Vitals:  Filed Vitals:   03/06/16 0216 03/06/16 0217  BP:    Pulse: 103 101  Temp:    Resp: 18 27    Last Pain:  Filed Vitals:   03/06/16 0219  PainSc: 0-No pain   Pain Goal:                 Alberta Cairns L

## 2016-03-06 NOTE — Progress Notes (Signed)
Pushed for 2+ hours, unable to bring vtx past +2 Discussed options, agreed to try vacuum assist. Bladder drained, adequate epidural.  Mushroom cup applied, with 2 ctx unable to get good enough seal to assist.  Bell cup applied, better seal obtained, able to get assist with 2 ctx, vertex did not descend. Recommended c-section for arrest of descent, discussed procedure and risks.  Will proceed with c-section.

## 2016-03-06 NOTE — Lactation Note (Signed)
This note was copied from a baby's chart. Lactation Consultation Note New mom had c-section, baby sleepy.assisted in football hold STS. Stimulated baby by giving colostrum w/spoon. Mom has very wide space between small breast! Small areola and short shaft nipples. D/t small breast tissue not very compressible. Has good flow of colostrum, gave in spoon to wake baby, and 93ml in #16NS. Gave mom shells to wear in bra when can. Hand pump given w/#21 flange to pre-pump to evert nipples prior to latching. Referred to Baby and Me Book in Breastfeeding section Pg. 22-23 for position options and Proper latch demonstration. Educated about newborn behavior, STS, I&O, cluster feeding, supply and demand. Mom encouraged to waken baby for feeds every 2-3 hours if hasn't cued. Will need more assistance when baby is more alert.  Patient Name: Jessica Schwartz [id number] Date: 03/06/2016 Reason for consult: Initial assessment   Maternal Data Has patient been taught Hand Expression?: Yes Does the patient have breastfeeding experience prior to this delivery?: No  Feeding Feeding Type: Breast Fed Length of feed: 0 min  LATCH Score/Interventions Latch: Too sleepy or reluctant, no latch achieved, no sucking elicited. Intervention(s): Skin to skin;Teach feeding cues;Waking techniques  Audible Swallowing: None Intervention(s): Hand expression;Skin to skin  Type of Nipple: Everted at rest and after stimulation (short shaft)  Comfort (Breast/Nipple): Soft / non-tender     Hold (Positioning): Assistance needed to correctly position infant at breast and maintain latch. Intervention(s): Breastfeeding basics reviewed;Support Pillows;Position options;Skin to skin  LATCH Score: 5  Lactation Tools Discussed/Used Tools: Shells;Nipple Shields;Pump;Flanges Nipple shield size: 16 Flange Size: Other (comment) (21) Shell Type: Inverted Breast pump type: Manual WIC Program: No Pump Review: Setup, frequency, and  cleaning;Milk Storage Initiated by:: Allayne Stack RN Date initiated:: 03/06/16   Consult Status Consult Status: Follow-up Date: 03/06/16 Follow-up type: In-patient    Duran Ohern, Elta Guadeloupe 03/06/2016, 6:12 AM

## 2016-03-06 NOTE — Progress Notes (Signed)
Subjective: Postpartum Day 0: Cesarean Delivery Patient reports incisional pain and tolerating PO.    Objective: Vital signs in last 24 hours: Temp:  [97.6 F (36.4 C)-99.2 F (37.3 C)] 98.1 F (36.7 C) (06/05 0630) Pulse Rate:  [69-151] 75 (06/05 0530) Resp:  [12-33] 18 (06/05 0630) BP: (96-142)/(42-87) 107/52 mmHg (06/05 0530) SpO2:  [92 %-99 %] 96 % (06/05 0630)  Physical Exam:  General: alert and no distress Lochia: appropriate Uterine Fundus: firm Incision: healing well DVT Evaluation: No evidence of DVT seen on physical exam.   Recent Labs  03/05/16 0605  HGB 12.4  HCT 36.4    Assessment/Plan: Status post Cesarean section. Doing well postoperatively.  Continue current care.  Jessica Schwartz, Jessica Schwartz 03/06/2016, 7:46 AM

## 2016-03-06 NOTE — Transfer of Care (Signed)
Immediate Anesthesia Transfer of Care Note  Patient: Jessica Schwartz  Procedure(s) Performed: Procedure(s): CESAREAN SECTION (N/A)  Patient Location: PACU  Anesthesia Type:Epidural  Level of Consciousness: awake  Airway & Oxygen Therapy: Patient Spontanous Breathing  Post-op Assessment: Report given to RN and Post -op Vital signs reviewed and stable  Post vital signs: stable  Last Vitals:  Filed Vitals:   03/05/16 2330 03/06/16 0000  BP: 134/42 120/77  Pulse: 116 125  Temp: 37.3 C   Resp: 20     Last Pain:  Filed Vitals:   03/06/16 0106  PainSc: 7          Complications: No apparent anesthesia complications

## 2016-03-06 NOTE — Addendum Note (Signed)
Addendum  created 03/06/16 1352 by Asher Muir, CRNA   Modules edited: Clinical Notes   Clinical Notes:  File: [id number]

## 2016-03-07 LAB — CBC
HCT: 27.6 % — ABNORMAL LOW (ref 36.0–46.0)
Hemoglobin: 9.3 g/dL — ABNORMAL LOW (ref 12.0–15.0)
MCH: 29.6 pg (ref 26.0–34.0)
MCHC: 33.7 g/dL (ref 30.0–36.0)
MCV: 87.9 fL (ref 78.0–100.0)
PLATELETS: 258 10*3/uL (ref 150–400)
RBC: 3.14 MIL/uL — AB (ref 3.87–5.11)
RDW: 14.8 % (ref 11.5–15.5)
WBC: 21 10*3/uL — AB (ref 4.0–10.5)

## 2016-03-07 NOTE — Lactation Note (Signed)
This note was copied from a baby's chart. Lactation Consultation Note  Mother has short shaft nipples, L shorter than R nipple.  Mother can easily hand express colostrum and is dripping. Mother states she breastfed earlier today for approx 60 min. She has been using coconut oil and has comfort gels.  Has a blister on the tip of R side. Upon entering mother was breastfeeding sleepy baby w/ #16NS. Suggest mother try latching on R side without NS. Baby was able to latch without NS on R side with breast compression and stayed latched for 10 min and fell asleep. Encouraged her to compress breast when latching and massage during feeding. Taught FOB how to perform chin tug when baby first latches to help with deeper latch. Mother was able to tolerate breastfeeding w/o NS.  Did give mother a #20NS to try if she needs it during the night. Encouraged STS.  Discussed cluster feeding. Placed shells on mother at the end of feeding and encouraged her to wear to elongate nipples.   Patient Name: Boy Tayven Leydon [id number] Date: 03/07/2016 Reason for consult: Follow-up assessment   Maternal Data    Feeding Feeding Type: Breast Fed Length of feed: 10 min  LATCH Score/Interventions Latch: Grasps breast easily, tongue down, lips flanged, rhythmical sucking. Intervention(s): Skin to skin;Waking techniques Intervention(s): Assist with latch;Breast massage;Breast compression  Audible Swallowing: A few with stimulation  Type of Nipple: Everted at rest and after stimulation (short shaft) Intervention(s): Hand pump;Shells Intervention(s): Shells  Comfort (Breast/Nipple): Filling, red/small blisters or bruises, mild/mod discomfort  Problem noted: Mild/Moderate discomfort Interventions  (Cracked/bleeding/bruising/blister): Expressed breast milk to nipple (coconut oil & gels)  Hold (Positioning): Assistance needed to correctly position infant at breast and maintain latch.  LATCH Score:  7  Lactation Tools Discussed/Used     Consult Status Consult Status: Follow-up Date: 03/08/16 Follow-up type: In-patient    Vivianne Master [facility] 03/07/2016, 3:06 PM

## 2016-03-07 NOTE — Progress Notes (Signed)
Ambulation encouraged, patient is passing flatus and Incentive spirometer encouraged. SCDs encouraged while in bed.

## 2016-03-07 NOTE — Progress Notes (Signed)
Subjective: Postpartum Day 1 Cesarean Delivery Patient reports tolerating PO and no problems voiding.  Incision a bit more sore  Objective: Vital signs in last 24 hours: Temp:  [97.8 F (36.6 C)-98.3 F (36.8 C)] 98.1 F (36.7 C) (06/06 0531) Pulse Rate:  [66-76] 76 (06/06 0531) Resp:  [16-18] 18 (06/06 0531) BP: (101-112)/(51-59) 112/58 mmHg (06/06 0531) SpO2:  [95 %-100 %] 100 % (06/06 0200)  Physical Exam:  General: alert and cooperative Lochia: appropriate Uterine Fundus: firm Incision: C/D/I    Recent Labs  03/05/16 0605 03/07/16 0557  HGB 12.4 9.3*  HCT 36.4 27.6*    Assessment/Plan: Status post Cesarean section. Doing well postoperatively.  Continue current care. D/w pt circumcision and desires to proceed D/w her using percocet intermittently now that duramorph has worn off  Jessica Schwartz W 03/07/2016, 8:31 AM

## 2016-03-08 NOTE — Progress Notes (Signed)
POD #2 Doing well, sore Afeb, VSS Abd- soft, fundus firm, incision intact Continue routine care, plan for d/c tomorrow

## 2016-03-09 MED ORDER — IBUPROFEN 800 MG PO TABS: 800.0000 mg | ORAL_TABLET | Freq: Three times a day (TID) | ORAL | Status: DC | PRN

## 2016-03-09 MED ORDER — PRENATAL 27-0.8 MG PO TABS: 1.0000 | ORAL_TABLET | Freq: Three times a day (TID) | ORAL | Status: DC

## 2016-03-09 MED ORDER — OXYCODONE-ACETAMINOPHEN 5-325 MG PO TABS: 1.0000 | ORAL_TABLET | Freq: Four times a day (QID) | ORAL | Status: DC | PRN

## 2016-03-09 NOTE — Lactation Note (Signed)
This note was copied from a baby's chart. Lactation Consultation Note: Mother has been using a #20 nipple shield. Mother wants to try breastfeeding without the nipple shield. Infant latched on the (R) breast without difficulty. Mother very excited . Advised mother to continue to breastfeed with feeding cues. Discussed supply and demand as well as cluster frequency of cluster feeding. Mother taught breast compression. Highlighted engorgement treatment in Baby and Me Book. Mother is aware of available [facility] services and community support.   Patient Name: Jessica Schwartz [id number] Date: 03/09/2016     Maternal Data    Feeding Feeding Type: Breast Fed Length of feed: 15 min  LATCH Score/Interventions Latch: Grasps breast easily, tongue down, lips flanged, rhythmical sucking. Intervention(s): Waking techniques Intervention(s): Assist with latch;Breast compression  Audible Swallowing: Spontaneous and intermittent Intervention(s): Skin to skin;Hand expression  Type of Nipple: Everted at rest and after stimulation Intervention(s): Hand pump Intervention(s): Reverse pressure  Comfort (Breast/Nipple): Filling, red/small blisters or bruises, mild/mod discomfort     Hold (Positioning): Assistance needed to correctly position infant at breast and maintain latch. Intervention(s): Support Pillows;Position options;Skin to skin  LATCH Score: 8  Lactation Tools Discussed/Used     Consult Status      Darla Lesches 03/09/2016, 9:32 AM

## 2016-03-09 NOTE — Progress Notes (Signed)
Subjective: Postpartum Day 3: Cesarean Delivery Patient reports incisional pain, tolerating PO and no problems voiding.   Nl lochia, pain controlled.    Objective: Vital signs in last 24 hours: Temp:  [97.5 F (36.4 C)-98 F (36.7 C)] 98 F (36.7 C) (06/08 0535) Pulse Rate:  [71-75] 71 (06/08 0535) Resp:  [18] 18 (06/08 0535) BP: (119-124)/(64-67) 119/64 mmHg (06/08 0535) SpO2:  [98 %] 98 % (06/07 1742)  Physical Exam:  General: alert and no distress Lochia: appropriate Uterine Fundus: firm Incision: healing well DVT Evaluation: No evidence of DVT seen on physical exam.   Recent Labs  03/07/16 0557  HGB 9.3*  HCT 27.6*    Assessment/Plan: Status post Cesarean section. Doing well postoperatively.  Discharge home with standard precautions and return to clinic in 2 weeks.  D/C with motrin, percocet and PNV.    [name], Muaaz Brau 03/09/2016, 8:16 AM

## 2016-03-09 NOTE — Discharge Summary (Signed)
OB Discharge Summary     Patient Name: Jessica Schwartz DOB: 1986/04/27 MRN: [id number]  Date of admission: 03/05/2016 Delivering MD: Jessica Schwartz, Jessica Schwartz   Date of discharge: 03/09/2016  Admitting diagnosis: active labor Intrauterine pregnancy: [redacted]w[redacted]d     Secondary diagnosis:  Active Problems:   Indication for care in labor or delivery  Additional problems: N/A     Discharge diagnosis: Term Pregnancy Delivered                                                                                                Post partum procedures:N/A  Augmentation: AROM and Pitocin  Complications: None  Hospital course:  Onset of Labor With Unplanned C/S  30 y.o. yo G1P1001 at [redacted]w[redacted]d was admitted in [place] on 03/05/2016. Patient had a labor course significant for Arrest of Descent - failed Vacuum. Membrane Rupture Time/Date: 3:46 PM ,03/05/2016   The patient went for cesarean section due to Arrest of Descent, and delivered a Viable infant,03/06/2016  Details of operation can be found in separate operative note. Patient had an uncomplicated postpartum course.  She is ambulating,tolerating a regular diet, passing flatus, and urinating well.  Patient is discharged home in stable condition 03/09/2016.  Physical exam  Filed Vitals:   03/07/16 1700 03/08/16 0545 03/08/16 1742 03/09/16 0535  BP: 108/64 109/56 124/67 119/64  Pulse: 79 69 75 71  Temp: 98 F (36.7 C) 98.7 F (37.1 C) 97.5 F (36.4 C) 98 F (36.7 C)  TempSrc: Oral Oral Oral   Resp: 18 18  18   Height:      Weight:      SpO2:   98%    General: alert and no distress Lochia: appropriate Uterine Fundus: firm Incision: Healing well with no significant drainage DVT Evaluation: No evidence of DVT seen on physical exam. Labs: Lab Results  Component Value Date   WBC 21.0* 03/07/2016   HGB 9.3* 03/07/2016   HCT 27.6* 03/07/2016   MCV 87.9 03/07/2016   PLT 258 03/07/2016   CMP Latest Ref Rng 11/21/2013  Glucose 70 - 99 mg/dL 128(H)  BUN 6  - 23 mg/dL 10  Creatinine 0.50 - 1.10 mg/dL 0.74  Sodium 137 - 147 mEq/L 137  Potassium 3.7 - 5.3 mEq/L 3.6(L)  Chloride 96 - 112 mEq/L 99  CO2 19 - 32 mEq/L 17(L)  Calcium 8.4 - 10.5 mg/dL 9.1  Total Protein 6.0 - 8.3 g/dL 7.2  Total Bilirubin 0.3 - 1.2 mg/dL 1.1  Alkaline Phos 39 - 117 U/L 46  AST 0 - 37 U/L 18  ALT 0 - 35 U/L 16    Discharge instruction: per After Visit Summary and "Baby and Me Booklet".  After visit meds:    Medication List    TAKE these medications        ibuprofen 800 MG tablet  Commonly known as:  ADVIL,MOTRIN  Take 1 tablet (800 mg total) by mouth every 8 (eight) hours as needed for moderate pain.     multivitamin-prenatal 27-0.8 MG Tabs tablet  Take 1 tablet by mouth 3 (three) times daily.  Diet: routine diet  Activity: Advance as tolerated. Pelvic rest for 6 weeks.   Outpatient follow up:2 weeks Follow up Appt:No future appointments. Follow up Visit:No Follow-up on file.  Postpartum contraception: Not Discussed  Newborn Data: Live born female  Birth Weight: 8 lb 9.6 oz (3900 g) APGAR: 8, 9  Baby Feeding: Breast Disposition:home with mother   03/09/2016 Jessica Contes, MD

## 2016-04-21 DIAGNOSIS — Z3009 Encounter for other general counseling and advice on contraception: Secondary | ICD-10-CM | POA: Diagnosis not present

## 2016-04-21 DIAGNOSIS — Z1389 Encounter for screening for other disorder: Secondary | ICD-10-CM | POA: Diagnosis not present

## 2016-05-17 DIAGNOSIS — D225 Melanocytic nevi of trunk: Secondary | ICD-10-CM | POA: Diagnosis not present

## 2016-08-07 ENCOUNTER — Ambulatory Visit (INDEPENDENT_AMBULATORY_CARE_PROVIDER_SITE_OTHER): Payer: 59 | Admitting: Family Medicine

## 2016-08-07 ENCOUNTER — Encounter: Payer: Self-pay | Admitting: Family Medicine

## 2016-08-07 VITALS — BP 99/64 | HR 75 | Temp 98.0°F | Resp 20 | Ht 64.0 in | Wt 124.5 lb

## 2016-08-07 DIAGNOSIS — Z7689 Persons encountering health services in other specified circumstances: Secondary | ICD-10-CM | POA: Diagnosis not present

## 2016-08-07 NOTE — Patient Instructions (Signed)
   It was a pleasure to meet you today.  

## 2016-08-07 NOTE — Progress Notes (Signed)
Patient ID: KESTRA ARMISTEAD, female  DOB: 04-24-1986, 30 y.o.   MRN: [id number] Patient Care Team    Relationship Specialty Notifications Start End  Ma Hillock, DO PCP - General Family Medicine  08/07/16   Paula Compton, MD Consulting Physician Obstetrics and Gynecology  08/07/16     Subjective:  MAKHYA TUMBARELLO is a 30 y.o.  female present for new patient establishment. All past medical history, surgical history, allergies, family history, immunizations, medications and social history were obtained and updated in the electronic medical record today. All recent labs, ED visits and hospitalizations within the last year were reviewed.  Health maintenance:  Colonoscopy: no fhx, screen at 50  Mammogram: completed: breast bx, benign.  Cervical cancer screening: last pap: 2016, results: normal, completed by: Dr. Marvel Plan. H/o 1 HPV + and colpo, normal PAP sincee.  Immunizations: tdap 2017, Influenza 2017(encouraged yearly) Infectious disease screening: HIV completed 2017 DEXA: N/A  There is no immunization history for the selected administration types on file for this patient.   History reviewed. No pertinent past medical history. No Known Allergies Past Surgical History:  Procedure Laterality Date  . BREAST SURGERY     biopsy-fibrocystic  . CESAREAN SECTION N/A 03/06/2016   Procedure: CESAREAN SECTION;  Surgeon: Cheri Fowler, MD;  Location: [facility];  Service: Obstetrics;  Laterality: N/A;  . WISDOM TOOTH EXTRACTION Bilateral 2008   Family History  Problem Relation Age of Onset  . Breast cancer Maternal Aunt 60  . Diabetes Maternal Grandmother   . Breast cancer Maternal Grandmother 60   Social History   Social History  . Marital status: Married    Spouse name: [name]  . Number of children: 1  . Years of education: 59   Occupational History  . RN    Social History Main Topics  . Smoking status: Never Smoker  . Smokeless tobacco: Never Used  .  Alcohol use No  . Drug use: No  . Sexual activity: Yes    Partners: Male    Birth control/ protection: Condom   Other Topics Concern  . Not on file   Social History Narrative   Married to brian Wiebelhaus. 1 child jackson.    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    Takes a daily vitamin   Wears her seatbelt, bicycle helmet, smoke detector in the home.   exercise routinely.    Feels safe in her relationships.      Medication List       Accurate as of 08/07/16  9:42 AM. Always use your most recent med list.          multivitamin-prenatal 27-0.8 MG Tabs tablet Take 1 tablet by mouth 3 (three) times daily.        No results found for this or any previous visit (from the past 2160 hour(s)).  No results found.   ROS: 14 pt review of systems performed and negative (unless mentioned in an HPI)  Objective: BP 99/64 (BP Location: Right Arm, Patient Position: Sitting, Cuff Size: Normal)   Pulse 75   Temp 98 F (36.7 C)   Resp 20   Ht 5\' 4"  (1.626 m)   Wt 124 lb 8 oz (56.5 kg)   SpO2 98%   Breastfeeding? Yes   BMI 21.37 kg/m  Gen: Afebrile. No acute distress. Nontoxic in appearance, well-developed, well-nourished,  Breast feeding mother.  HENT: AT. [place]. MMM Eyes:Pupils Equal Round Reactive to light, Extraocular movements intact,  Conjunctiva  without redness, discharge or icterus. Neck/lymp/endocrine: Supple,no lymphadenopathy CV: RRR no murmur, no edema, +2/4 P posterior tibialis pulses Chest: CTAB, no wheeze, rhonchi or crackles. Abd: Soft. NTND. BS present. Skin: Warm and well-perfused. Skin intact. Neuro/Msk: Normal gait. PERLA. EOMi. Alert. Oriented x3.   Psych: Normal affect, dress and demeanor. Normal speech. Normal thought content and judgment.  Assessment/plan: RONNIKA PINCH is a 30 y.o. female present for  Establishing care with new doctor, encounter for Patient is a currently breast-feeding mother - baby Glennon Mac is 5 months. Mother breastfeeding and condom use for  pregnancy prevention. Counseled on pregnancy prevention and routine CPE.  - F/u PRN   Greater than 30 minutes spent with patient, >50% of time spent face to face   Return for CPE.  Electronically signed by: Howard Pouch, DO [place]

## 2016-08-21 DIAGNOSIS — D3131 Benign neoplasm of right choroid: Secondary | ICD-10-CM | POA: Diagnosis not present

## 2016-08-21 DIAGNOSIS — H11153 Pinguecula, bilateral: Secondary | ICD-10-CM | POA: Diagnosis not present

## 2016-08-28 ENCOUNTER — Ambulatory Visit (INDEPENDENT_AMBULATORY_CARE_PROVIDER_SITE_OTHER): Payer: 59 | Admitting: Family Medicine

## 2016-08-28 ENCOUNTER — Encounter: Payer: Self-pay | Admitting: Family Medicine

## 2016-08-28 VITALS — BP 95/60 | HR 59 | Temp 97.7°F | Resp 18 | Ht 64.0 in | Wt 122.0 lb

## 2016-08-28 DIAGNOSIS — Z130 Encounter for screening for diseases of the blood and blood-forming organs and certain disorders involving the immune mechanism: Secondary | ICD-10-CM

## 2016-08-28 DIAGNOSIS — Z0000 Encounter for general adult medical examination without abnormal findings: Secondary | ICD-10-CM

## 2016-08-28 DIAGNOSIS — Z131 Encounter for screening for diabetes mellitus: Secondary | ICD-10-CM

## 2016-08-28 DIAGNOSIS — Z0189 Encounter for other specified special examinations: Secondary | ICD-10-CM

## 2016-08-28 DIAGNOSIS — E559 Vitamin D deficiency, unspecified: Secondary | ICD-10-CM | POA: Diagnosis not present

## 2016-08-28 DIAGNOSIS — Z1329 Encounter for screening for other suspected endocrine disorder: Secondary | ICD-10-CM

## 2016-08-28 DIAGNOSIS — Z8249 Family history of ischemic heart disease and other diseases of the circulatory system: Secondary | ICD-10-CM | POA: Diagnosis not present

## 2016-08-28 LAB — TSH: TSH: 1.15 u[IU]/mL (ref 0.35–4.50)

## 2016-08-28 LAB — LIPID PANEL
Cholesterol: 195 mg/dL (ref 0–200)
HDL: 75.9 mg/dL (ref >39.00)
LDL Cholesterol: 109 mg/dL — ABNORMAL HIGH (ref 0–99)
NONHDL: 119.27
Total CHOL/HDL Ratio: 3
Triglycerides: 50 mg/dL (ref 0.0–149.0)
VLDL: 10 mg/dL (ref 0.0–40.0)

## 2016-08-28 LAB — BASIC METABOLIC PANEL
BUN: 16 mg/dL (ref 6–23)
CALCIUM: 9.6 mg/dL (ref 8.4–10.5)
CO2: 29 meq/L (ref 19–32)
Chloride: 107 mEq/L (ref 96–112)
Creatinine, Ser: 0.64 mg/dL (ref 0.40–1.20)
GFR: 115.24 mL/min (ref >60.00)
GLUCOSE: 77 mg/dL (ref 70–99)
Potassium: 5 mEq/L (ref 3.5–5.1)
SODIUM: 143 meq/L (ref 135–145)

## 2016-08-28 LAB — CBC WITH DIFFERENTIAL/PLATELET
Basophils Absolute: 0 10*3/uL (ref 0.0–0.1)
Basophils Relative: 0.7 % (ref 0.0–3.0)
EOS PCT: 5 % (ref 0.0–5.0)
Eosinophils Absolute: 0.3 10*3/uL (ref 0.0–0.7)
HEMATOCRIT: 38.9 % (ref 36.0–46.0)
HEMOGLOBIN: 12.9 g/dL (ref 12.0–15.0)
LYMPHS PCT: 41.3 % (ref 12.0–46.0)
Lymphs Abs: 2.4 10*3/uL (ref 0.7–4.0)
MCHC: 33.1 g/dL (ref 30.0–36.0)
MCV: 87.8 fl (ref 78.0–100.0)
MONO ABS: 0.3 10*3/uL (ref 0.1–1.0)
MONOS PCT: 5.6 % (ref 3.0–12.0)
Neutro Abs: 2.7 10*3/uL (ref 1.4–7.7)
Neutrophils Relative %: 47.4 % (ref 43.0–77.0)
Platelets: 313 10*3/uL (ref 150.0–400.0)
RBC: 4.43 Mil/uL (ref 3.87–5.11)
RDW: 14.4 % (ref 11.5–15.5)
WBC: 5.7 10*3/uL (ref 4.0–10.5)

## 2016-08-28 LAB — HEMOGLOBIN A1C: Hgb A1c MFr Bld: 5.4 % (ref 4.6–6.5)

## 2016-08-28 LAB — VITAMIN D 25 HYDROXY (VIT D DEFICIENCY, FRACTURES): VITD: 30.58 ng/mL (ref 30.00–100.00)

## 2016-08-28 NOTE — Progress Notes (Signed)
Patient ID: Jessica Schwartz, female  DOB: 30-Mar-1986, 30 y.o.   MRN: [id number] Patient Care Team    Relationship Specialty Notifications Start End  Ma Hillock, DO PCP - General Family Medicine  08/07/16   Paula Compton, MD Consulting Physician Obstetrics and Gynecology  08/07/16     Subjective:  Jessica Schwartz is a 30 y.o.  Female  present for CPE. All past medical history, surgical history, allergies, family history, immunizations, medications and social history were updated in the electronic medical record today. All recent labs, ED visits and hospitalizations within the last year were reviewed.  She is breast feeding mother. No complaints today.    Health maintenance:  Colonoscopy: no fhx, screen at 50  Mammogram: completed: breast bx, benign.  Cervical cancer screening: last pap: 2016, results: normal, completed by: Dr. Marvel Plan. H/o 1 HPV + and colpo, normal PAP since Immunizations: tdap 2017, Influenza 2017(encouraged yearly) Infectious disease screening: HIV completed 2017 DEXA: N/A Assistive device: none Oxygen [id number] Patient has a Dental home. Hospitalizations/ED visits: none  Depression screen [facility] 2/9 08/28/2016 08/07/2016  Decreased Interest 0 0  Down, Depressed, Hopeless 0 0  PHQ - 2 Score 0 0     There is no immunization history for the selected administration types on file for this patient.   History reviewed. No pertinent past medical history. No Known Allergies Past Surgical History:  Procedure Laterality Date  . BREAST SURGERY     biopsy-fibrocystic  . CESAREAN SECTION N/A 03/06/2016   Procedure: CESAREAN SECTION;  Surgeon: Cheri Fowler, MD;  Location: [place];  Service: Obstetrics;  Laterality: N/A;  . WISDOM TOOTH EXTRACTION Bilateral 2008   Family History  Problem Relation Age of Onset  . Breast cancer Maternal Aunt 60  . Diabetes Maternal Grandmother   . Breast cancer Maternal Grandmother 60  . Heart attack  Paternal Grandfather 33  . Hyperlipidemia Paternal Grandfather    Social History   Social History  . Marital status: Married    Spouse name: [name]  . Number of children: 1  . Years of education: 65   Occupational History  . RN    Social History Main Topics  . Smoking status: Never Smoker  . Smokeless tobacco: Never Used  . Alcohol use No  . Drug use: No  . Sexual activity: Yes    Partners: Male    Birth control/ protection: Condom   Other Topics Concern  . Not on file   Social History Narrative   Married to brian Mcelhaney. 1 child jackson.    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    Takes a daily vitamin   Wears her seatbelt, bicycle helmet, smoke detector in the home.   exercise routinely.    Feels safe in her relationships.      Medication List       Accurate as of 08/28/16  8:46 AM. Always use your most recent med list.          multivitamin-prenatal 27-0.8 MG Tabs tablet Take 1 tablet by mouth 3 (three) times daily.        No results found for this or any previous visit (from the past 2160 hour(s)).  No results found.   ROS: 14 pt review of systems performed and negative (unless mentioned in an HPI)  Objective: BP 95/60 (BP Location: Left Arm, Patient Position: Sitting, Cuff Size: Normal)   Pulse (!) 59   Temp 97.7 F (36.5 C)  Resp 18   Ht 5\' 4"  (1.626 m)   Wt 122 lb (55.3 kg)   SpO2 99%   BMI 20.94 kg/m  Gen: Afebrile. No acute distress. Nontoxic in appearance, well-developed, well-nourished,  Pleasant, thin female.  HENT: AT. [place]. Bilateral TM visualized and normal in appearance, normal external auditory canal. MMM, no oral lesions, adequate dentition. Bilateral nares within normal limits. Throat without erythema, ulcerations or exudates. no Cough on exam, no hoarseness on exam. Eyes:Pupils Equal Round Reactive to light, Extraocular movements intact,  Conjunctiva without redness, discharge or icterus. Neck/lymp/endocrine: Supple,no lymphadenopathy, no  thyromegaly CV: RRR nomurmur, no edema, +2/4 P posterior tibialis pulses. no carotid bruits. No JVD. Chest: CTAB, no wheeze, rhonchi or crackles. Normal  Respiratory effort. good Air movement. Abd: Soft. flat. NTND. BS present. no Masses palpated. No hepatosplenomegaly. No rebound tenderness or guarding. Skin: no rashes, purpura or petechiae. Warm and well-perfused. Skin intact. Neuro/Msk:  Normal gait. PERLA. EOMi. Alert. Oriented x3.  Cranial nerves II through XII intact. Muscle strength 5/5 upper/lower extremity. DTRs equal bilaterally. Psych: Normal affect, dress and demeanor. Normal speech. Normal thought content and judgment.  Assessment/plan: RICKEL CETINA is a 30 y.o. female present for CPE. Encounter for preventive health examination Patient was encouraged to exercise greater than 150 minutes a week. Patient was encouraged to choose a diet filled with fresh fruits and vegetables, and lean meats. AVS provided to patient today for education/recommendation on gender specific health and safety maintenance.  - All health maintenance UTD Family history of heart disease - Lipid panel - follow every 1-5 years, depending on results.  Vitamin D deficiency - h/o vit d deficiency, she takes a prenatal vit. And supplemental vit d (she is unsure of the dose). She is breastfeeding . - Vitamin D (25 hydroxy) Diabetes mellitus screening - HgB A1c Thyroid disorder screen - TSH Screening for deficiency anemia - CBC w/Diff Routine lab draw - Basic Metabolic Panel (BMET) '   Return for CPE. 1 year  Electronically signed by: Howard Pouch, DO [place]

## 2016-08-28 NOTE — Patient Instructions (Signed)

## 2016-08-29 ENCOUNTER — Telehealth: Payer: Self-pay | Admitting: Family Medicine

## 2016-08-29 NOTE — Telephone Encounter (Signed)
Please call pt;  her labs look great. Her vit d is low normal (30). She should make certain she is getting at least 600-800 u daily.

## 2016-08-29 NOTE — Telephone Encounter (Signed)
Left detailed message with  instructions on patient voice mail per DPR 

## 2016-11-15 DIAGNOSIS — D485 Neoplasm of uncertain behavior of skin: Secondary | ICD-10-CM | POA: Diagnosis not present

## 2016-11-15 DIAGNOSIS — D225 Melanocytic nevi of trunk: Secondary | ICD-10-CM | POA: Diagnosis not present

## 2016-11-15 DIAGNOSIS — D2272 Melanocytic nevi of left lower limb, including hip: Secondary | ICD-10-CM | POA: Diagnosis not present

## 2016-11-21 DIAGNOSIS — D3132 Benign neoplasm of left choroid: Secondary | ICD-10-CM | POA: Diagnosis not present

## 2017-05-21 DIAGNOSIS — Z1389 Encounter for screening for other disorder: Secondary | ICD-10-CM | POA: Diagnosis not present

## 2017-05-21 DIAGNOSIS — Z01419 Encounter for gynecological examination (general) (routine) without abnormal findings: Secondary | ICD-10-CM | POA: Diagnosis not present

## 2017-05-21 DIAGNOSIS — Z130 Encounter for screening for diseases of the blood and blood-forming organs and certain disorders involving the immune mechanism: Secondary | ICD-10-CM | POA: Diagnosis not present

## 2017-05-21 DIAGNOSIS — D3131 Benign neoplasm of right choroid: Secondary | ICD-10-CM | POA: Diagnosis not present

## 2017-06-23 ENCOUNTER — Encounter (HOSPITAL_COMMUNITY): Payer: Self-pay | Admitting: *Deleted

## 2017-06-23 ENCOUNTER — Emergency Department (HOSPITAL_COMMUNITY): Payer: 59

## 2017-06-23 ENCOUNTER — Emergency Department (HOSPITAL_COMMUNITY)
Admission: EM | Admit: 2017-06-23 | Discharge: 2017-06-23 | Disposition: A | Discharge status: Home / Self Care | Payer: 59 | Attending: Emergency Medicine | Admitting: Emergency Medicine

## 2017-06-23 DIAGNOSIS — Z3491 Encounter for supervision of normal pregnancy, unspecified, first trimester: Secondary | ICD-10-CM | POA: Insufficient documentation

## 2017-06-23 DIAGNOSIS — R1030 Lower abdominal pain, unspecified: Secondary | ICD-10-CM

## 2017-06-23 DIAGNOSIS — Z3490 Encounter for supervision of normal pregnancy, unspecified, unspecified trimester: Secondary | ICD-10-CM

## 2017-06-23 DIAGNOSIS — R102 Pelvic and perineal pain: Secondary | ICD-10-CM

## 2017-06-23 DIAGNOSIS — O26891 Other specified pregnancy related conditions, first trimester: Secondary | ICD-10-CM | POA: Diagnosis not present

## 2017-06-23 DIAGNOSIS — O9989 Other specified diseases and conditions complicating pregnancy, childbirth and the puerperium: Secondary | ICD-10-CM | POA: Diagnosis not present

## 2017-06-23 DIAGNOSIS — R9431 Abnormal electrocardiogram [ECG] [EKG]: Secondary | ICD-10-CM | POA: Diagnosis not present

## 2017-06-23 DIAGNOSIS — Z3A01 Less than 8 weeks gestation of pregnancy: Secondary | ICD-10-CM | POA: Diagnosis not present

## 2017-06-23 DIAGNOSIS — M25511 Pain in right shoulder: Secondary | ICD-10-CM | POA: Insufficient documentation

## 2017-06-23 LAB — CBC WITH DIFFERENTIAL/PLATELET
BASOS PCT: 0 %
Basophils Absolute: 0 10*3/uL (ref 0.0–0.1)
EOS ABS: 0.1 10*3/uL (ref 0.0–0.7)
Eosinophils Relative: 1 %
HCT: 36.8 % (ref 36.0–46.0)
HEMOGLOBIN: 12.6 g/dL (ref 12.0–15.0)
LYMPHS ABS: 1.2 10*3/uL (ref 0.7–4.0)
Lymphocytes Relative: 19 %
MCH: 29.8 pg (ref 26.0–34.0)
MCHC: 34.2 g/dL (ref 30.0–36.0)
MCV: 87 fL (ref 78.0–100.0)
MONO ABS: 0.3 10*3/uL (ref 0.1–1.0)
Monocytes Relative: 5 %
Neutro Abs: 4.6 10*3/uL (ref 1.7–7.7)
Neutrophils Relative %: 75 %
Platelets: 298 10*3/uL (ref 150–400)
RBC: 4.23 MIL/uL (ref 3.87–5.11)
RDW: 12.9 % (ref 11.5–15.5)
WBC: 6.2 10*3/uL (ref 4.0–10.5)

## 2017-06-23 LAB — WET PREP, GENITAL
CLUE CELLS WET PREP: NONE SEEN
Sperm: NONE SEEN
TRICH WET PREP: NONE SEEN
Yeast Wet Prep HPF POC: NONE SEEN

## 2017-06-23 LAB — COMPREHENSIVE METABOLIC PANEL
ALBUMIN: 4.2 g/dL (ref 3.5–5.0)
ALK PHOS: 59 U/L (ref 38–126)
ALT: 31 U/L (ref 14–54)
ANION GAP: 10 (ref 5–15)
AST: 28 U/L (ref 15–41)
BILIRUBIN TOTAL: 1.3 mg/dL — AB (ref 0.3–1.2)
BUN: <5 mg/dL — ABNORMAL LOW (ref 6–20)
CALCIUM: 9.2 mg/dL (ref 8.9–10.3)
CO2: 19 mmol/L — AB (ref 22–32)
Chloride: 106 mmol/L (ref 101–111)
Creatinine, Ser: 0.59 mg/dL (ref 0.44–1.00)
GFR calc non Af Amer: >60 mL/min (ref >60)
GLUCOSE: 96 mg/dL (ref 65–99)
POTASSIUM: 3.3 mmol/L — AB (ref 3.5–5.1)
SODIUM: 135 mmol/L (ref 135–145)
TOTAL PROTEIN: 7.1 g/dL (ref 6.5–8.1)

## 2017-06-23 LAB — D-DIMER, QUANTITATIVE: D-Dimer, Quant: 0.31 ug/mL-FEU (ref 0.00–0.50)

## 2017-06-23 LAB — HCG, QUANTITATIVE, PREGNANCY: hCG, Beta Chain, Quant, S: 15539 m[IU]/mL — ABNORMAL HIGH (ref <5)

## 2017-06-23 LAB — URINALYSIS, ROUTINE W REFLEX MICROSCOPIC
BILIRUBIN URINE: NEGATIVE
GLUCOSE, UA: NEGATIVE mg/dL
Hgb urine dipstick: NEGATIVE
KETONES UR: 20 mg/dL — AB
Leukocytes, UA: NEGATIVE
Nitrite: NEGATIVE
PH: 6 (ref 5.0–8.0)
PROTEIN: NEGATIVE mg/dL
Specific Gravity, Urine: 1.009 (ref 1.005–1.030)

## 2017-06-23 LAB — ABO/RH: ABO/RH(D): O POS

## 2017-06-23 LAB — TYPE AND SCREEN
ABO/RH(D): O POS
ANTIBODY SCREEN: NEGATIVE

## 2017-06-23 NOTE — ED Triage Notes (Signed)
Pt found out she was pregnant a week ago by home pregnancy test.  Pt has not seen her Ob for this pregnancy.  Pt was working today and has been having pain under her shoulder blades all day.  Pt then suddenly began having abdominal cramping during a code.  No bleeding or leaking of vaginal fluids with this.  Pt states that she became concerned about this.  Pt states that the pain in her lower abdominal area has subsided but she still has pain under her left shoulder blade.  Pt is tearful and anxious on arrival to the ED.  Pt denies any sob with this, no recent immobility, travel or other risk factors. Pt states that the pain increases with movement and deep breathing.  Nurses upstairs called rapid response RN and were told to bring her down to the ED to be evaluated for ectopic pregnancy.  At the time of arrival to the ED pt does not appear in any distress on arrival.

## 2017-06-23 NOTE — ED Provider Notes (Signed)
[facility] DEPT Provider Note   CSN: [id number] Arrival date & time: 06/23/17  1919     History   Chief Complaint Chief Complaint  Patient presents with  . Shoulder Pain  . Abdominal Pain    HPI Jessica Schwartz is a 31 y.o. female.  HPI 31 year old female who presents with right shoulder pain and low abdominal pain. States recent positive home pregnancy test, with LMP 05/20/2017. This morning, noticed sharp right scapular pain, worse with deep inspiration. At work today, with severe sharp low abdominal cramping. No vaginal bleeding, discharge, urinary complaints or flank pain. States she became very anxious, but no dyspnea currently, syncope or near syncope. History of prior cesarean section. No nausea, vomiting, diarrhea. No other alleviating or aggravating factors. Did not take any medications or treatments prior to arrival.  Since arrival to the ED, states that pain has gradually improved and now resolved.  History reviewed. No pertinent past medical history.  There are no active problems to display for this patient.   Past Surgical History:  Procedure Laterality Date  . BREAST SURGERY     biopsy-fibrocystic  . CESAREAN SECTION N/A 03/06/2016   Procedure: CESAREAN SECTION;  Surgeon: Cheri Fowler, MD;  Location: [place];  Service: Obstetrics;  Laterality: N/A;  . WISDOM TOOTH EXTRACTION Bilateral 2008    OB History    Gravida Para Term Preterm AB Living   2 1 1     1    SAB TAB Ectopic Multiple Live Births         0 1       Home Medications    Prior to Admission medications   Medication Sig Start Date End Date Taking? Authorizing Provider  Prenatal Vit-Fe Fumarate-FA (MULTIVITAMIN-PRENATAL) 27-0.8 MG TABS tablet Take 1 tablet by mouth 3 (three) times daily. 03/09/16   Bovard-StuckertJeral Fruit, MD    Family History Family History  Problem Relation Age of Onset  . Breast cancer Maternal Aunt 60  . Diabetes Maternal Grandmother   . Breast cancer  Maternal Grandmother 60  . Heart attack Paternal Grandfather 59  . Hyperlipidemia Paternal Grandfather     Social History Social History  Substance Use Topics  . Smoking status: Never Smoker  . Smokeless tobacco: Never Used  . Alcohol use No     Allergies   Patient has no known allergies.   Review of Systems Review of Systems  Constitutional: Negative for fever.  Respiratory: Negative for shortness of breath.   Cardiovascular: Negative for leg swelling.  Gastrointestinal: Positive for abdominal pain.  Genitourinary: Positive for pelvic pain. Negative for dyspareunia.  All other systems reviewed and are negative.    Physical Exam Updated Vital Signs BP 100/61   Pulse 95   Temp 98.2 F (36.8 C) (Oral)   Resp 19   Ht 5\' 4"  (1.626 m)   Wt 52.6 kg (116 lb)   LMP 05/20/2017 (Approximate)   SpO2 97%   Breastfeeding? Yes   BMI 19.91 kg/m   Physical Exam Physical Exam  Nursing note and vitals reviewed. Constitutional: Well developed, well nourished, non-toxic, and in no acute distress but does appear anxious Head: Normocephalic and atraumatic.  Mouth/Throat: Oropharynx is clear and moist.  Neck: Normal range of motion. Neck supple.  Cardiovascular: Normal rate and regular rhythm.   Pulmonary/Chest: Effort normal and breath sounds normal.  Abdominal: Soft. There is low abdominal/pelvis tenderness. There is no rebound and no guarding.  Musculoskeletal: Normal range of motion. no edema.  No calf tenderness Neurological: Alert, no facial droop, fluent speech, moves all extremities symmetrically Skin: Skin is warm and dry.  Psychiatric: Cooperative Pelvic: Normal external genitalia. Normal internal genitalia. No discharge. No blood within the vagina. No cervical motion tenderness. No adnexal masses or tenderness.   ED Treatments / Results  Labs (all labs ordered are listed, but only abnormal results are displayed) Labs Reviewed  WET PREP, GENITAL - Abnormal; Notable  for the following:       Result Value   WBC, Wet Prep HPF POC FEW (*)    All other components within normal limits  COMPREHENSIVE METABOLIC PANEL - Abnormal; Notable for the following:    Potassium 3.3 (*)    CO2 19 (*)    BUN <5 (*)    Total Bilirubin 1.3 (*)    All other components within normal limits  HCG, QUANTITATIVE, PREGNANCY - Abnormal; Notable for the following:    hCG, Beta Chain, Quant, S 15,539 (*)    All other components within normal limits  URINALYSIS, ROUTINE W REFLEX MICROSCOPIC - Abnormal; Notable for the following:    Ketones, ur 20 (*)    All other components within normal limits  CBC WITH DIFFERENTIAL/PLATELET  D-DIMER, QUANTITATIVE (NOT AT [facility])  TYPE AND SCREEN  ABO/RH  GC/CHLAMYDIA PROBE AMP () NOT AT [facility]    EKG  EKG Interpretation  Date/Time:  Saturday June 23 2017 19:57:24 EDT Ventricular Rate:  95 PR Interval:    QRS Duration: 85 QT Interval:  339 QTC Calculation: 427 R Axis:   70 Text Interpretation:  Sinus rhythm Atrial premature complex no prior EKG  Confirmed by Brantley Stage [phone]) on 06/23/2017 8:00:56 PM       Radiology US Ob Comp Less 14 Wks  Result Date: 06/23/2017 CLINICAL DATA:  Pelvic pain EXAM: OBSTETRIC <14 WK [place] AND TRANSVAGINAL OB US DOPPLER ULTRASOUND OF OVARIES TECHNIQUE: Both transabdominal and transvaginal ultrasound examinations were performed for complete evaluation of the gestation as well as the maternal uterus, adnexal regions, and pelvic cul-de-sac. Transvaginal technique was performed to assess early pregnancy. Color and duplex Doppler ultrasound was utilized to evaluate blood flow to the ovaries. COMPARISON:  None. FINDINGS: Intrauterine gestational sac: Single intrauterine gestational sac Yolk sac:  Visualized Embryo:  Not seen Cardiac Activity: Not seen MSD: 9.8  mm   5 w   5  d Subchorionic hemorrhage:  None visualized. Maternal uterus/adnexae: Bilateral ovaries are within normal limits. Right ovary  measures 2.8 x 2 x 2.5 cm. Left ovary measures 2.8 x 1.4 x 2.5 cm. No significant free fluid Pulsed Doppler evaluation of both ovaries demonstrates normal appearing low-resistance arterial and venous waveforms. IMPRESSION: 1. Negative for ovarian torsion 2. Single intrauterine pregnancy with visualization of yolk sac but no embryo. Consider follow-up ultrasound in 10-14 days to confirm viability. Electronically Signed   By: Donavan Foil M.D.   On: 06/23/2017 21:36   US Ob Transvaginal  Result Date: 06/23/2017 CLINICAL DATA:  Pelvic pain EXAM: OBSTETRIC <14 WK [place] AND TRANSVAGINAL OB US DOPPLER ULTRASOUND OF OVARIES TECHNIQUE: Both transabdominal and transvaginal ultrasound examinations were performed for complete evaluation of the gestation as well as the maternal uterus, adnexal regions, and pelvic cul-de-sac. Transvaginal technique was performed to assess early pregnancy. Color and duplex Doppler ultrasound was utilized to evaluate blood flow to the ovaries. COMPARISON:  None. FINDINGS: Intrauterine gestational sac: Single intrauterine gestational sac Yolk sac:  Visualized Embryo:  Not seen Cardiac Activity: Not seen MSD:  9.8  mm   5 w   5  d Subchorionic hemorrhage:  None visualized. Maternal uterus/adnexae: Bilateral ovaries are within normal limits. Right ovary measures 2.8 x 2 x 2.5 cm. Left ovary measures 2.8 x 1.4 x 2.5 cm. No significant free fluid Pulsed Doppler evaluation of both ovaries demonstrates normal appearing low-resistance arterial and venous waveforms. IMPRESSION: 1. Negative for ovarian torsion 2. Single intrauterine pregnancy with visualization of yolk sac but no embryo. Consider follow-up ultrasound in 10-14 days to confirm viability. Electronically Signed   By: Donavan Foil M.D.   On: 06/23/2017 21:36   US Pelvic Doppler (torsion R/o Or Mass Arterial Flow)  Result Date: 06/23/2017 CLINICAL DATA:  Pelvic pain EXAM: OBSTETRIC <14 WK [place] AND TRANSVAGINAL OB US DOPPLER ULTRASOUND OF  OVARIES TECHNIQUE: Both transabdominal and transvaginal ultrasound examinations were performed for complete evaluation of the gestation as well as the maternal uterus, adnexal regions, and pelvic cul-de-sac. Transvaginal technique was performed to assess early pregnancy. Color and duplex Doppler ultrasound was utilized to evaluate blood flow to the ovaries. COMPARISON:  None. FINDINGS: Intrauterine gestational sac: Single intrauterine gestational sac Yolk sac:  Visualized Embryo:  Not seen Cardiac Activity: Not seen MSD: 9.8  mm   5 w   5  d Subchorionic hemorrhage:  None visualized. Maternal uterus/adnexae: Bilateral ovaries are within normal limits. Right ovary measures 2.8 x 2 x 2.5 cm. Left ovary measures 2.8 x 1.4 x 2.5 cm. No significant free fluid Pulsed Doppler evaluation of both ovaries demonstrates normal appearing low-resistance arterial and venous waveforms. IMPRESSION: 1. Negative for ovarian torsion 2. Single intrauterine pregnancy with visualization of yolk sac but no embryo. Consider follow-up ultrasound in 10-14 days to confirm viability. Electronically Signed   By: Donavan Foil M.D.   On: 06/23/2017 21:36    Procedures Procedures (including critical care time) EMERGENCY DEPARTMENT [place] FAST EXAM "Limited Ultrasound of the Abdomen and Pericardium" (FAST Exam).   INDICATIONS:pregnant with abdominal pain Multiple views of the abdomen and pericardium are obtained with a multi-frequency probe.  PERFORMED BY: Myself IMAGES ARCHIVED?: No LIMITATIONS:  None INTERPRETATION:  No abdominal free fluid   Medications Ordered in ED Medications - No data to display   Initial Impression / Assessment and Plan / ED Course  I have reviewed the triage vital signs and the nursing notes.  Pertinent labs & imaging results that were available during my care of the patient were reviewed by me and considered in my medical decision making (see chart for details).     Patient well appearing and in  no acute distress. Soft and benign abdomen. No free fluid on bedside ultrasound. Beta hcg is elevated, and subsequent US shows IUP. Low suspicion for ectopic now. Pelvic exam unremarkable.   With right scapular pain, that is pleuritic. Not fully reproduced. EKG unremarkable. D-dimer is negative and she is felt ruled out for PE. Did discuss CXR but she would like to defer this at this time. No URI or pneumonia symptoms. Lungs clear bilaterally, and low suspicion for PTX. States she will return for worsening or progressive symptoms.   She will follow-up closely with OB physician. Strict return and follow-up instructions reviewed. She expressed understanding of all discharge instructions and felt comfortable with the plan of care.   Final Clinical Impressions(s) / ED Diagnoses   Final diagnoses:  Pelvic pain  Lower abdominal pain  First trimester pregnancy  Intrauterine pregnancy  Acute pain of right shoulder    New Prescriptions  Discharge Medication List as of 06/23/2017 11:09 PM       Forde Dandy, MD 06/23/17 [phone]

## 2017-06-23 NOTE — Discharge Instructions (Signed)
Your ddimer was negative and you are ruled out for pulmonary embolism. Take tylenol and use heat packs for pain.  You have a gestational and yolk sac on the ultrasound measuring at about 5 weeks 5 days. Please follow-up with your [facility] physician.  Return for worsening symptoms, including difficulty breathing passing out, worsening pain, vaginal bleeding or any other symptoms concerning to you.

## 2017-06-23 NOTE — ED Notes (Signed)
Patient transported to Ultrasound 

## 2017-06-25 LAB — GC/CHLAMYDIA PROBE AMP (~~LOC~~) NOT AT ARMC
Chlamydia: NEGATIVE
Neisseria Gonorrhea: NEGATIVE

## 2017-07-03 DIAGNOSIS — N912 Amenorrhea, unspecified: Secondary | ICD-10-CM | POA: Diagnosis not present

## 2017-07-03 DIAGNOSIS — Z3201 Encounter for pregnancy test, result positive: Secondary | ICD-10-CM | POA: Diagnosis not present

## 2017-07-30 DIAGNOSIS — Z3481 Encounter for supervision of other normal pregnancy, first trimester: Secondary | ICD-10-CM | POA: Diagnosis not present

## 2017-07-30 DIAGNOSIS — Z3689 Encounter for other specified antenatal screening: Secondary | ICD-10-CM | POA: Diagnosis not present

## 2017-07-30 DIAGNOSIS — Z36 Encounter for antenatal screening for chromosomal anomalies: Secondary | ICD-10-CM | POA: Diagnosis not present

## 2017-07-30 DIAGNOSIS — Z3491 Encounter for supervision of normal pregnancy, unspecified, first trimester: Secondary | ICD-10-CM | POA: Diagnosis not present

## 2017-07-30 LAB — OB RESULTS CONSOLE ABO/RH: RH Type: POSITIVE

## 2017-07-30 LAB — OB RESULTS CONSOLE GC/CHLAMYDIA
Chlamydia: NEGATIVE
GC PROBE AMP, GENITAL: NEGATIVE

## 2017-07-30 LAB — OB RESULTS CONSOLE ANTIBODY SCREEN: ANTIBODY SCREEN: NEGATIVE

## 2017-07-30 LAB — OB RESULTS CONSOLE HIV ANTIBODY (ROUTINE TESTING): HIV: NONREACTIVE

## 2017-07-30 LAB — OB RESULTS CONSOLE RUBELLA ANTIBODY, IGM: Rubella: IMMUNE

## 2017-07-30 LAB — OB RESULTS CONSOLE HEPATITIS B SURFACE ANTIGEN: Hepatitis B Surface Ag: NEGATIVE

## 2017-07-30 LAB — OB RESULTS CONSOLE RPR: RPR: NONREACTIVE

## 2017-08-09 MED FILL — CEFUROXIME AXETIL 500 MG TA: 500 | 7 days supply | Qty: 14 | Fill #0

## 2017-08-13 DIAGNOSIS — Z3682 Encounter for antenatal screening for nuchal translucency: Secondary | ICD-10-CM | POA: Diagnosis not present

## 2017-09-18 DIAGNOSIS — R8271 Bacteriuria: Secondary | ICD-10-CM | POA: Diagnosis not present

## 2017-09-21 ENCOUNTER — Inpatient Hospital Stay (HOSPITAL_COMMUNITY)
Admission: AD | Admit: 2017-09-21 | Discharge: 2017-09-21 | Disposition: A | Discharge status: Home / Self Care | Payer: 59 | Source: Ambulatory Visit | Attending: Obstetrics and Gynecology | Admitting: Obstetrics and Gynecology

## 2017-09-21 ENCOUNTER — Encounter (HOSPITAL_COMMUNITY): Payer: Self-pay | Admitting: *Deleted

## 2017-09-21 DIAGNOSIS — Z79899 Other long term (current) drug therapy: Secondary | ICD-10-CM | POA: Diagnosis not present

## 2017-09-21 DIAGNOSIS — R109 Unspecified abdominal pain: Secondary | ICD-10-CM | POA: Diagnosis present

## 2017-09-21 DIAGNOSIS — Z8249 Family history of ischemic heart disease and other diseases of the circulatory system: Secondary | ICD-10-CM | POA: Diagnosis not present

## 2017-09-21 DIAGNOSIS — Z803 Family history of malignant neoplasm of breast: Secondary | ICD-10-CM | POA: Insufficient documentation

## 2017-09-21 DIAGNOSIS — Z3A17 17 weeks gestation of pregnancy: Secondary | ICD-10-CM | POA: Diagnosis not present

## 2017-09-21 DIAGNOSIS — Z9889 Other specified postprocedural states: Secondary | ICD-10-CM | POA: Insufficient documentation

## 2017-09-21 DIAGNOSIS — O26892 Other specified pregnancy related conditions, second trimester: Secondary | ICD-10-CM | POA: Diagnosis present

## 2017-09-21 DIAGNOSIS — R1084 Generalized abdominal pain: Secondary | ICD-10-CM | POA: Diagnosis not present

## 2017-09-21 DIAGNOSIS — Z833 Family history of diabetes mellitus: Secondary | ICD-10-CM | POA: Diagnosis not present

## 2017-09-21 LAB — URINALYSIS, ROUTINE W REFLEX MICROSCOPIC
BILIRUBIN URINE: NEGATIVE
GLUCOSE, UA: NEGATIVE mg/dL
HGB URINE DIPSTICK: NEGATIVE
Ketones, ur: NEGATIVE mg/dL
Leukocytes, UA: NEGATIVE
Nitrite: NEGATIVE
Protein, ur: NEGATIVE mg/dL
SPECIFIC GRAVITY, URINE: 1.003 — AB (ref 1.005–1.030)
pH: 7 (ref 5.0–8.0)

## 2017-09-21 MED ORDER — PRENATAL 27-0.8 MG PO TABS: 1.0000 | ORAL_TABLET | Freq: Every day | ORAL | 3 refills | Status: DC

## 2017-09-21 NOTE — MAU Provider Note (Signed)
  History    Jessica Schwartz is a 31 y.o. G2P1001 at [redacted]w[redacted]d w/ c/o recurrent tightening and pressure, felt "uncomfortable" while at work and came to be assessed for this. Describes discomfort more like pressure not pain. Intermittent every few minutes, lasting a few seconds. Thinks may be BH ctx. Has gotten much better since resting on stretcher in MAU.  No N/V, no LOF, no VB. Denies dysuria/frequency, no perineal irritation/itching.  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 Reports similar episode a few days ago while working.  + BM today, + flatus.   Regular PNC at [facility], course complicated by GBS bacteriuria treated and neg TOC 09/18/17. Previous C/S for arrest of descent.   CSN: [id number]  Arrival date and time: 09/21/17 1642   First Provider Initiated Contact with Patient 09/21/17 1758      Chief Complaint  Patient presents with  . Abdominal Pain   HPI  OB History    Gravida Para Term Preterm AB Living   2 1 1  0 0 1   SAB TAB Ectopic Multiple Live Births   0 0 0 0 1      History reviewed. No pertinent past medical history.  Past Surgical History:  Procedure Laterality Date  . BREAST SURGERY     biopsy-fibrocystic  . CESAREAN SECTION N/A 03/06/2016   Procedure: CESAREAN SECTION;  Surgeon: Cheri Fowler, MD;  Location: [facility];  Service: Obstetrics;  Laterality: N/A;  . WISDOM TOOTH EXTRACTION Bilateral 2008    Family History  Problem Relation Age of Onset  . Breast cancer Maternal Aunt 60  . Diabetes Maternal Grandmother   . Breast cancer Maternal Grandmother 60  . Heart attack Paternal Grandfather 4  . Hyperlipidemia Paternal Grandfather     Social History   Tobacco Use  . Smoking status: Never Smoker  . Smokeless tobacco: Never Used  Substance Use Topics  . Alcohol use: No  . Drug use: No    Allergies: No Known Allergies  Medications Prior to Admission  Medication Sig Dispense Refill Last Dose  . Prenatal Vit-Fe Fumarate-FA  (MULTIVITAMIN-PRENATAL) 27-0.8 MG TABS tablet Take 1 tablet by mouth 3 (three) times daily. 100 each 3 Taking    Review of Systems  Constitutional: Negative.   Gastrointestinal: Negative.   Genitourinary: Negative for frequency, pelvic pain, urgency, vaginal bleeding, vaginal discharge and vaginal pain.   Physical Exam   Blood pressure 106/60, pulse 79, temperature 98.1 F (36.7 C), temperature source Oral, resp. rate 18, height 5\' 3"  (1.6 m), weight 56.3 kg (124 lb 0.6 oz), last menstrual period 05/20/2017, SpO2 99 %, currently breastfeeding.  Physical Exam  Constitutional: She is oriented to person, place, and time. She appears well-developed and well-nourished.  Cardiovascular: Normal rate and regular rhythm.  Respiratory: Effort normal and breath sounds normal.  GI: Soft. Bowel sounds are normal. She exhibits distension. There is no tenderness. There is no rebound and no guarding.  Gravid, S=D <mild gas distention across upper abdomen  Musculoskeletal: Normal range of motion.  Neurological: She is alert and oriented to person, place, and time.  Skin: Skin is warm and dry.  + FHT 140's Uterus palp soft, no ctx  MAU Course  Procedures  UA wnl  Assessment and Plan  G2P1 at 17.5 wks Fetal status reassuring, no s/sx PTL Abdominal pressure resolving, suspect gas Routine precautions given F/U in office as scheduled 10/05/17  Juliene Pina 09/21/2017, 6:05 PM

## 2017-09-21 NOTE — Discharge Instructions (Signed)
Abdominal Bloating °When you have abdominal bloating, your abdomen may feel full, tight, or painful. It may also look bigger than normal or swollen (distended). Common causes of abdominal bloating include: °· Swallowing air. °· Constipation. °· Problems digesting food. °· Eating too much. °· Irritable bowel syndrome. This is a condition that affects the large intestine. °· Lactose intolerance. This is an inability to digest lactose, a natural sugar in dairy products. °· Celiac disease. This is a condition that affects the ability to digest gluten, a protein found in some grains. °· Gastroparesis. This is a condition that slows down the movement of food in the stomach and small intestine. It is more common in people with diabetes mellitus. °· Gastroesophageal reflux disease (GERD). This is a digestive condition that makes stomach acid flow back into the esophagus. °· Urinary retention. This means that the body is holding onto urine, and the bladder cannot be emptied all the way. ° °Follow these instructions at home: °Eating and drinking °· Avoid eating too much. °· Try not to swallow air while talking or eating. °· Avoid eating while lying down. °· Avoid these foods and drinks: °? Foods that cause gas, such as broccoli, cabbage, cauliflower, and baked beans. °? Carbonated drinks. °? Hard candy. °? Chewing gum. °Medicines °· Take over-the-counter and prescription medicines only as told by your health care provider. °· Take probiotic medicines. These medicines contain live bacteria or yeasts that can help digestion. °· Take coated peppermint oil capsules. °Activity °· Try to exercise regularly. Exercise may help to relieve bloating that is caused by gas and relieve constipation. °General instructions °· Keep all follow-up visits as told by your health care provider. This is important. °Contact a health care provider if: °· You have nausea and vomiting. °· You have diarrhea. °· You have abdominal pain. °· You have  unusual weight loss or weight gain. °· You have severe pain, and medicines do not help. °Get help right away if: °· You have severe chest pain. °· You have trouble breathing. °· You have shortness of breath. °· You have trouble urinating. °· You have darker urine than normal. °· You have blood in your stools or have dark, tarry stools. °Summary °· Abdominal bloating means that the abdomen is swollen. °· Common causes of abdominal bloating are swallowing air, constipation, and problems digesting food. °· Avoid eating too much and avoid swallowing air. °· Avoid foods that cause gas, carbonated drinks, hard candy, and chewing gum. °This information is not intended to replace advice given to you by your health care provider. Make sure you discuss any questions you have with your health care provider. °Document Released: 10/20/2016 Document Revised: 10/20/2016 Document Reviewed: 10/20/2016 °Elsevier Interactive Patient Education © 2018 Elsevier Inc. ° °

## 2017-09-21 NOTE — MAU Note (Signed)
Patient presents with lower abdominal pain x 3 hours, was a work, then seen at [place] and sent here for further evaluation.

## 2017-10-02 NOTE — L&D Delivery Note (Signed)
Delivery Note Labor onset - 0200 02/23/18 Active labor - 1600 Second stage - 1812 SROM clear AF - 1918  Sterile water papules for severe back labor prior to onset of active second stage. Nitrous gas in active labor Patient utilized various position changes to help start active labor and fetal descent.   At 7:42 PM a viable female was delivered via VBAC, Spontaneous (Presentation: OA, shoulders delivered in horizontal plane  ).  APGAR: 7, 9; weight pending .   Placenta status: S/C/I , Delena Bali.  Cord: 3VC with the following complications: short.  Cord pH: 7.132  Newborn stunned at birth, good heart rate, good tone, minimal respiratory effort after stimulation and suction. PPV 3 breaths given on mother's abdomen with onset of respirations and cry, then secondary apnea noted. Cord cut and baby to RN for further resuscitation on radiant warmer.  Baby received blow by O2 and returned to mother's arms after 15 min of life, transitioning well.   Anesthesia:  Local lido Episiotomy: None Lacerations: 2nd degree;Perineal Suture Repair: 3.0 vicryl Est. Blood Loss (mL): 150  Mom to postpartum.  Baby to Couplet care / Skin to Skin.  Juliene Pina, CNM 02/23/2018, 8:31 PM

## 2017-10-05 DIAGNOSIS — Z3A19 19 weeks gestation of pregnancy: Secondary | ICD-10-CM | POA: Diagnosis not present

## 2017-10-05 DIAGNOSIS — O26892 Other specified pregnancy related conditions, second trimester: Secondary | ICD-10-CM | POA: Diagnosis not present

## 2017-11-13 DIAGNOSIS — D1801 Hemangioma of skin and subcutaneous tissue: Secondary | ICD-10-CM | POA: Diagnosis not present

## 2017-11-13 DIAGNOSIS — D225 Melanocytic nevi of trunk: Secondary | ICD-10-CM | POA: Diagnosis not present

## 2017-11-13 DIAGNOSIS — L821 Other seborrheic keratosis: Secondary | ICD-10-CM | POA: Diagnosis not present

## 2017-11-13 DIAGNOSIS — I788 Other diseases of capillaries: Secondary | ICD-10-CM | POA: Diagnosis not present

## 2017-11-13 DIAGNOSIS — L84 Corns and callosities: Secondary | ICD-10-CM | POA: Diagnosis not present

## 2017-11-13 DIAGNOSIS — D2339 Other benign neoplasm of skin of other parts of face: Secondary | ICD-10-CM | POA: Diagnosis not present

## 2017-12-06 DIAGNOSIS — Z3A28 28 weeks gestation of pregnancy: Secondary | ICD-10-CM | POA: Diagnosis not present

## 2017-12-06 DIAGNOSIS — Z3689 Encounter for other specified antenatal screening: Secondary | ICD-10-CM | POA: Diagnosis not present

## 2017-12-06 DIAGNOSIS — Z23 Encounter for immunization: Secondary | ICD-10-CM | POA: Diagnosis not present

## 2017-12-06 DIAGNOSIS — O26893 Other specified pregnancy related conditions, third trimester: Secondary | ICD-10-CM | POA: Diagnosis not present

## 2018-02-01 DIAGNOSIS — Z3685 Encounter for antenatal screening for Streptococcus B: Secondary | ICD-10-CM | POA: Diagnosis not present

## 2018-02-08 DIAGNOSIS — Z3483 Encounter for supervision of other normal pregnancy, third trimester: Secondary | ICD-10-CM | POA: Diagnosis not present

## 2018-02-11 ENCOUNTER — Inpatient Hospital Stay (HOSPITAL_COMMUNITY)
Admission: AD | Admit: 2018-02-11 | Discharge: 2018-02-11 | Disposition: A | Discharge status: Home / Self Care | Payer: 59 | Source: Ambulatory Visit | Attending: Obstetrics and Gynecology | Admitting: Obstetrics and Gynecology

## 2018-02-11 ENCOUNTER — Encounter (HOSPITAL_COMMUNITY): Payer: Self-pay

## 2018-02-11 ENCOUNTER — Other Ambulatory Visit: Payer: Self-pay

## 2018-02-11 DIAGNOSIS — Z3A38 38 weeks gestation of pregnancy: Secondary | ICD-10-CM | POA: Diagnosis not present

## 2018-02-11 DIAGNOSIS — R109 Unspecified abdominal pain: Secondary | ICD-10-CM

## 2018-02-11 DIAGNOSIS — Z79899 Other long term (current) drug therapy: Secondary | ICD-10-CM | POA: Diagnosis not present

## 2018-02-11 DIAGNOSIS — O471 False labor at or after 37 completed weeks of gestation: Secondary | ICD-10-CM | POA: Diagnosis not present

## 2018-02-11 DIAGNOSIS — O26892 Other specified pregnancy related conditions, second trimester: Secondary | ICD-10-CM

## 2018-02-11 NOTE — MAU Note (Signed)
Pt presents with complaint of contractions, denies bleeding or ROM

## 2018-02-11 NOTE — MAU Provider Note (Signed)
History     CSN: [id number]  Arrival date and time: 02/11/18 1937   None     Chief Complaint  Patient presents with  . Labor Eval   HPI Jessica Schwartz is a 32 yo G2P1001 at 38+1 weeks presenting for a labor check.  She reports her contractions started around 9:30pm, but woke her up around 12:30am and have progressively gotten stronger.  She denies VB or LOF and endorses good fetal movement.  EFW by sono on 5/10 - 6#12.  Last baby 8#7oz.   Prenatal complications: GBS bacteriuria treated with neg TOC 09/18/17; prior C/S for arrest of descent with failed vacuum.   OB History    Gravida  2   Para  1   Term  1   Preterm  0   AB  0   Living  1     SAB  0   TAB  0   Ectopic  0   Multiple  0   Live Births  1           History reviewed. No pertinent past medical history.  Past Surgical History:  Procedure Laterality Date  . BREAST SURGERY     biopsy-fibrocystic  . CESAREAN SECTION N/A 03/06/2016   Procedure: CESAREAN SECTION;  Surgeon: Cheri Fowler, MD;  Location: [facility];  Service: Obstetrics;  Laterality: N/A;  . WISDOM TOOTH EXTRACTION Bilateral 2008    Family History  Problem Relation Age of Onset  . Breast cancer Maternal Aunt 60  . Diabetes Maternal Grandmother   . Breast cancer Maternal Grandmother 60  . Heart attack Paternal Grandfather 51  . Hyperlipidemia Paternal Grandfather     Social History   Tobacco Use  . Smoking status: Never Smoker  . Smokeless tobacco: Never Used  Substance Use Topics  . Alcohol use: No  . Drug use: No    Allergies: No Known Allergies  Medications Prior to Admission  Medication Sig Dispense Refill Last Dose  . Prenatal Vit-Fe Fumarate-FA (MULTIVITAMIN-PRENATAL) 27-0.8 MG TABS tablet Take 1 tablet by mouth daily. 100 each 3     Review of Systems  Constitutional: Negative.   HENT: Negative.   Respiratory: Negative.   Gastrointestinal: Negative.   Genitourinary: Negative for vaginal bleeding.        Painful contractions every 2-3 minutes   Musculoskeletal: Negative for back pain.  Neurological: Negative.   Psychiatric/Behavioral: Negative.    Physical Exam   Blood pressure 122/62, pulse 76, temperature (!) 97.5 F (36.4 C), temperature source Oral, resp. rate 18, last menstrual period 05/20/2017, SpO2 100 %, currently breastfeeding.  Physical Exam  Constitutional: She is oriented to person, place, and time. She appears well-developed and well-nourished.  Genitourinary:  Genitourinary Comments: Gravid, non-tender Dilation: 2.5-3 Effacement (%): 70 Station: -2 Presentation: Vertex Exam by:: Lars Pinks, CNM  Musculoskeletal: She exhibits no edema.  Neurological: She is alert and oriented to person, place, and time.  Skin: Skin is warm and dry.  Psychiatric: She has a normal mood and affect.   Fetal monitoring: Baseline: 130 bpm/ moderate variability/ +accels/ no decels Toco: difficulty tracing, but every 2-3 minutes per patient report  MAU Course  Procedures   Assessment and Plan  1. Single IUP at 38+1 weeks early labor; GBS positive      - Category 1 fetal tracing     - Recommend ambulation for 1 hour and reassess    Darliss Cheney 02/11/2018, 9:03 AM   Reassessment at 9:20am: S:  pt. Has been ambulating for over 1 hour.  States contractions are more irregular, but still painful.  Denies VB or LOF.  Feeling exhausted since she has been up since 12:30am.   O: Blood pressure 122/62, pulse 76, temperature (!) 97.5 F (36.4 C), temperature source Oral, resp. rate 18, last menstrual period 05/20/2017, SpO2 100 %, currently breastfeeding.  Dilation: 3 Effacement (%): 70 Station: -2 Presentation: Vertex Exam by:: Lars Pinks, CNM   Fetal monitoring: Baseline: 130 bpm/ moderate variability/ +accels/ no decels Toco: every 3-5 minutes/mild    A/P: 1. Single IUP at 38+1 week in early labor, GBS positive     - no cervical change in over 1 hour      -  Discussed options therapeutic rest in MAU vs. Going home: pt. Desires to go home and rest     - Encouraged hydration, warm bath, eating breakfast, resting - discussed Benadryl or Tylenol PM for rest; declines stronger medication at hospital     - Labor precautions and warning s/s reviewed     - FKC's daily      - Discussed labor check in office later this afternoon if things progress     - If no labor, plan to f/u at [facility] appt on Friday     - Discharge home; all questions answered  Dr. Pamala Hurry updated with plan of care   Lars Pinks, MSN, CNM [facility]

## 2018-02-23 ENCOUNTER — Inpatient Hospital Stay (HOSPITAL_COMMUNITY)
Admission: AD | Admit: 2018-02-23 | Discharge: 2018-02-25 | DRG: 806 | Disposition: A | Discharge status: Home / Self Care | Payer: 59 | Source: Ambulatory Visit

## 2018-02-23 ENCOUNTER — Other Ambulatory Visit: Payer: Self-pay

## 2018-02-23 ENCOUNTER — Encounter (HOSPITAL_COMMUNITY): Payer: Self-pay | Admitting: Emergency Medicine

## 2018-02-23 DIAGNOSIS — Z3A39 39 weeks gestation of pregnancy: Secondary | ICD-10-CM

## 2018-02-23 DIAGNOSIS — O26892 Other specified pregnancy related conditions, second trimester: Secondary | ICD-10-CM

## 2018-02-23 DIAGNOSIS — O99824 Streptococcus B carrier state complicating childbirth: Secondary | ICD-10-CM | POA: Diagnosis present

## 2018-02-23 DIAGNOSIS — O34219 Maternal care for unspecified type scar from previous cesarean delivery: Principal | ICD-10-CM | POA: Diagnosis not present

## 2018-02-23 DIAGNOSIS — O34211 Maternal care for low transverse scar from previous cesarean delivery: Secondary | ICD-10-CM | POA: Diagnosis not present

## 2018-02-23 DIAGNOSIS — R109 Unspecified abdominal pain: Secondary | ICD-10-CM

## 2018-02-23 DIAGNOSIS — O872 Hemorrhoids in the puerperium: Secondary | ICD-10-CM | POA: Diagnosis not present

## 2018-02-23 LAB — TYPE AND SCREEN
ABO/RH(D): O POS
ANTIBODY SCREEN: NEGATIVE

## 2018-02-23 LAB — CBC
HCT: 38.8 % (ref 36.0–46.0)
Hemoglobin: 13.2 g/dL (ref 12.0–15.0)
MCH: 29.7 pg (ref 26.0–34.0)
MCHC: 34 g/dL (ref 30.0–36.0)
MCV: 87.2 fL (ref 78.0–100.0)
PLATELETS: 337 10*3/uL (ref 150–400)
RBC: 4.45 MIL/uL (ref 3.87–5.11)
RDW: 14 % (ref 11.5–15.5)
WBC: 20 10*3/uL — AB (ref 4.0–10.5)

## 2018-02-23 MED ORDER — OXYCODONE-ACETAMINOPHEN 5-325 MG PO TABS: 1.0000 | ORAL_TABLET | ORAL | Status: DC | PRN

## 2018-02-23 MED ORDER — TETANUS-DIPHTH-ACELL PERTUSSIS 5-2.5-18.5 LF-MCG/0.5 IM SUSP: 0.5000 mL | Freq: Once | INTRAMUSCULAR | Status: DC

## 2018-02-23 MED ORDER — PROMETHAZINE HCL 25 MG/ML IJ SOLN
25.0000 mg | Freq: Once | INTRAMUSCULAR | Status: AC
  Administered 2018-02-23: 25 mg via INTRAVENOUS
  Filled 2018-02-23: qty 1

## 2018-02-23 MED ORDER — SODIUM CHLORIDE 0.9% FLUSH: 3.0000 mL | INTRAVENOUS | Status: DC | PRN

## 2018-02-23 MED ORDER — DIBUCAINE 1 % RE OINT
1.0000 "application " | TOPICAL_OINTMENT | RECTAL | Status: DC | PRN
  Administered 2018-02-24: 1 via RECTAL
  Filled 2018-02-23: qty 28

## 2018-02-23 MED ORDER — ZOLPIDEM TARTRATE 5 MG PO TABS: 5.0000 mg | ORAL_TABLET | Freq: Every evening | ORAL | Status: DC | PRN

## 2018-02-23 MED ORDER — PENICILLIN G POT IN DEXTROSE 60000 UNIT/ML IV SOLN
3.0000 10*6.[IU] | INTRAVENOUS | Status: DC
  Filled 2018-02-23 (×4): qty 50

## 2018-02-23 MED ORDER — SODIUM CHLORIDE 0.9 % IV SOLN
5.0000 10*6.[IU] | Freq: Once | INTRAVENOUS | Status: DC
  Filled 2018-02-23: qty 5

## 2018-02-23 MED ORDER — LACTATED RINGERS IV BOLUS
500.0000 mL | Freq: Once | INTRAVENOUS | Status: AC
  Administered 2018-02-23: 500 mL via INTRAVENOUS

## 2018-02-23 MED ORDER — ACETAMINOPHEN 325 MG PO TABS
650.0000 mg | ORAL_TABLET | ORAL | Status: DC | PRN
  Administered 2018-02-24: 650 mg via ORAL
  Filled 2018-02-23 (×2): qty 2

## 2018-02-23 MED ORDER — ONDANSETRON HCL 4 MG PO TABS: 4.0000 mg | ORAL_TABLET | ORAL | Status: DC | PRN

## 2018-02-23 MED ORDER — BUTORPHANOL TARTRATE 1 MG/ML IJ SOLN
INTRAMUSCULAR | Status: AC
  Administered 2018-02-23: 2 mg via INTRAVENOUS
  Filled 2018-02-23: qty 1

## 2018-02-23 MED ORDER — OXYTOCIN 40 UNITS IN LACTATED RINGERS INFUSION - SIMPLE MED
2.5000 [IU]/h | INTRAVENOUS | Status: DC
  Filled 2018-02-23: qty 1000

## 2018-02-23 MED ORDER — SENNOSIDES-DOCUSATE SODIUM 8.6-50 MG PO TABS
2.0000 | ORAL_TABLET | ORAL | Status: DC
  Administered 2018-02-24 – 2018-02-25 (×2): 2 via ORAL
  Filled 2018-02-23 (×2): qty 2

## 2018-02-23 MED ORDER — LIDOCAINE HCL (PF) 1 % IJ SOLN
30.0000 mL | INTRAMUSCULAR | Status: AC | PRN
  Administered 2018-02-23: 30 mL via SUBCUTANEOUS
  Filled 2018-02-23: qty 30

## 2018-02-23 MED ORDER — SODIUM CHLORIDE 0.9% FLUSH: 3.0000 mL | Freq: Two times a day (BID) | INTRAVENOUS | Status: DC

## 2018-02-23 MED ORDER — OXYCODONE-ACETAMINOPHEN 5-325 MG PO TABS: 2.0000 | ORAL_TABLET | ORAL | Status: DC | PRN

## 2018-02-23 MED ORDER — WITCH HAZEL-GLYCERIN EX PADS
1.0000 "application " | MEDICATED_PAD | CUTANEOUS | Status: DC | PRN
  Administered 2018-02-24: 1 via TOPICAL

## 2018-02-23 MED ORDER — SIMETHICONE 80 MG PO CHEW: 80.0000 mg | CHEWABLE_TABLET | ORAL | Status: DC | PRN

## 2018-02-23 MED ORDER — STERILE WATER FOR INJECTION IJ SOLN
20.0000 mL | Freq: Once | INTRAMUSCULAR | Status: DC
  Filled 2018-02-23: qty 20

## 2018-02-23 MED ORDER — SOD CITRATE-CITRIC ACID 500-334 MG/5ML PO SOLN: 30.0000 mL | ORAL | Status: DC | PRN

## 2018-02-23 MED ORDER — ACETAMINOPHEN 325 MG PO TABS: 650.0000 mg | ORAL_TABLET | ORAL | Status: DC | PRN

## 2018-02-23 MED ORDER — BISACODYL 10 MG RE SUPP: 10.0000 mg | Freq: Every day | RECTAL | Status: DC | PRN

## 2018-02-23 MED ORDER — ONDANSETRON HCL 4 MG/2ML IJ SOLN: 4.0000 mg | Freq: Four times a day (QID) | INTRAMUSCULAR | Status: DC | PRN

## 2018-02-23 MED ORDER — PRENATAL MULTIVITAMIN CH
1.0000 | ORAL_TABLET | Freq: Every day | ORAL | Status: DC
  Administered 2018-02-24 – 2018-02-25 (×2): 1 via ORAL
  Filled 2018-02-23 (×2): qty 1

## 2018-02-23 MED ORDER — FLEET ENEMA 7-19 GM/118ML RE ENEM: 1.0000 | ENEMA | Freq: Every day | RECTAL | Status: DC | PRN

## 2018-02-23 MED ORDER — SODIUM CHLORIDE 0.9 % IV SOLN: 250.0000 mL | INTRAVENOUS | Status: DC | PRN

## 2018-02-23 MED ORDER — IBUPROFEN 600 MG PO TABS
600.0000 mg | ORAL_TABLET | Freq: Four times a day (QID) | ORAL | Status: DC
  Administered 2018-02-23 – 2018-02-25 (×8): 600 mg via ORAL
  Filled 2018-02-23 (×7): qty 1

## 2018-02-23 MED ORDER — BUTORPHANOL TARTRATE 1 MG/ML IJ SOLN
2.0000 mg | Freq: Once | INTRAMUSCULAR | Status: AC
  Administered 2018-02-23: 2 mg via INTRAVENOUS
  Filled 2018-02-23: qty 2

## 2018-02-23 MED ORDER — COCONUT OIL OIL: 1.0000 "application " | TOPICAL_OIL | Status: DC | PRN

## 2018-02-23 MED ORDER — OXYTOCIN 10 UNIT/ML IJ SOLN
10.0000 [IU] | Freq: Once | INTRAMUSCULAR | Status: DC
  Filled 2018-02-23: qty 1

## 2018-02-23 MED ORDER — LACTATED RINGERS IV SOLN
500.0000 mL | INTRAVENOUS | Status: DC | PRN
Start: 2018-02-23 — End: 2018-02-23

## 2018-02-23 MED ORDER — BENZOCAINE-MENTHOL 20-0.5 % EX AERO
1.0000 "application " | INHALATION_SPRAY | CUTANEOUS | Status: DC | PRN
  Administered 2018-02-23: 1 via TOPICAL
  Filled 2018-02-23: qty 56

## 2018-02-23 MED ORDER — SODIUM CHLORIDE 0.9 % IV SOLN
2.0000 g | Freq: Four times a day (QID) | INTRAVENOUS | Status: DC
  Administered 2018-02-23: 2 g via INTRAVENOUS
  Filled 2018-02-23: qty 2000
  Filled 2018-02-23: qty 2

## 2018-02-23 MED ORDER — BUTORPHANOL TARTRATE 1 MG/ML IJ SOLN
2.0000 mg | Freq: Once | INTRAMUSCULAR | Status: AC
  Administered 2018-02-23: 2 mg via INTRAVENOUS
  Filled 2018-02-23 (×2): qty 2

## 2018-02-23 MED ORDER — ONDANSETRON HCL 4 MG/2ML IJ SOLN: 4.0000 mg | INTRAMUSCULAR | Status: DC | PRN

## 2018-02-23 MED ORDER — OXYTOCIN BOLUS FROM INFUSION
500.0000 mL | Freq: Once | INTRAVENOUS | Status: AC
  Administered 2018-02-23: 500 mL via INTRAVENOUS

## 2018-02-23 MED ORDER — DIPHENHYDRAMINE HCL 25 MG PO CAPS
25.0000 mg | ORAL_CAPSULE | Freq: Four times a day (QID) | ORAL | Status: DC | PRN
Start: 2018-02-23 — End: 2018-02-25

## 2018-02-23 NOTE — Anesthesia Pain Management Evaluation Note (Signed)
  CRNA Pain Management Visit Note  Patient: Jessica Schwartz, 32 y.o., female  "Hello I am a member of the anesthesia team at [facility]. We have an anesthesia team available at all times to provide care throughout the hospital, including epidural management and anesthesia for C-section. I don't know your plan for the delivery whether it a natural birth, water birth, IV sedation, nitrous supplementation, doula or epidural, but we want to meet your pain goals."   1.Was your pain managed to your expectations on prior hospitalizations?   Yes   2.What is your expectation for pain management during this hospitalization?     IV pain meds and Nitrous Oxide  3.How can we help you reach that goal?   Record the patient's initial score and the patient's pain goal.   Pain: 7  Pain Goal: 10 [facility] wants you to be able to say your pain was always managed very well.  Rayvon Char 02/23/2018

## 2018-02-23 NOTE — Progress Notes (Signed)
S: Patient reports sleeping and feeling better rested after IV narcotics x 2.  Ctx continue with back pain. Has been up at [facility] and using birthing ball for position changes, spouse coaching and providing labor support.   O: VSSAF FHR 120, minimal variability since narcotic, + accels, small, no decels Toco: q 4-5 min, palp mild/mod  Dilation: 4 Effacement (%): 90 Station: -1 Presentation: Vertex Exam by:: Aron Baba RN  A/P:  G2P1 39.6 wks in latent labor Hx C/S x 1 Desires TOLAC GBS positive  Therapeutic rest effective, patient feels like she can cope with back labor Encouraged various positions to help fetal rotation and descent May use Nitrous gas for relief  Discussed risk of prolonged labor with uterine scar, recc decision for expediting delivery if no change in next few hours.  Options for augmentation with close monitoring of internal uterine pressures under epidural analgesia, vs repeat cesarean section Family will discuss and decide. GBS prophylaxis planned in active labor.   Juliene Pina, MSN, CNM 02/23/2018, 3:33 PM

## 2018-02-23 NOTE — Progress Notes (Signed)
S: Painful ctx, crying with some, using Nitrous and labor support from doula and spouse  O:  Vitals:   02/23/18 1544 02/23/18 1709  BP: 102/69 (!) 100/43  Pulse: (!) 111 83  Resp: 20 20  Temp:    SpO2:  100%   FHR 140, + accels, no decels, mod var, intermittent auscultation Ctx q 2-3 min, palp strong  Dilation: 10 Effacement (%): 100 Station: -1, 0 Presentation: Vertex Exam by:: Melina Copa, CNM   A/P: Rapid transition from latency to transition labor FHT Cat 1 Coping well with natural labor  GBS positive, will give Ampicillin protocol for impending birth.   Cautious anticipate VBAC Position changes to facilitate descent - "flying cowgirl" on peanut ball Continued labor support and Nitrous gas for comfort  Juliene Pina, MSN, CNM 02/23/2018, 6:21 PM

## 2018-02-23 NOTE — H&P (Signed)
OB ADMISSION/ HISTORY & PHYSICAL:  Admission Date: 02/23/2018  8:55 AM  Admit Diagnosis: Term pregnancy, previous cesarean, desires TOLAC, in latent labor   Jessica Schwartz is a 32 y.o. female presenting for contractions, painful in back and front, started yesterday afternoon. Became intense at 0300 today, feeling lots of pressure and difficulty relaxing. No LOF or VB, + FM. Desires TOLAC.   Prenatal History: G2P1001   EDC : 02/24/2018 Prenatal care at [facility] since [redacted] weeks gestation  Prenatal course complicated by hx C/S, arrest of descent and failed vacuum, positive GBS UTI, treated  Prenatal Labs: ABO, Rh: O (10/29 0000)  Antibody: Negative (10/29 0000) Rubella: Immune (10/29 0000)  RPR: Nonreactive (10/29 0000)  HBsAg: Negative (10/29 0000)  HIV: Non-reactive (10/29 0000)  GBS:   positive 1 hr Glucola : 86 Ultrascreen negative Normal anatomy, posterior placenta, XX EFW 7.5 lbs per Leopold's   Medical / Surgical History :  Past medical history: History reviewed. No pertinent past medical history.   Past surgical history:  Past Surgical History:  Procedure Laterality Date  . BREAST SURGERY     biopsy-fibrocystic  . CESAREAN SECTION N/A 03/06/2016   Procedure: CESAREAN SECTION;  Surgeon: Cheri Fowler, MD;  Location: [place];  Service: Obstetrics;  Laterality: N/A;  . WISDOM TOOTH EXTRACTION Bilateral 2008     Family History:  Family History  Problem Relation Age of Onset  . Breast cancer Maternal Aunt 60  . Diabetes Maternal Grandmother   . Breast cancer Maternal Grandmother 60  . Heart attack Paternal Grandfather 24  . Hyperlipidemia Paternal Grandfather      Social History:  reports that she has never smoked. She has never used smokeless tobacco. She reports that she does not drink alcohol or use drugs.   Allergies: Patient has no known allergies.    Current Medications at time of admission:  Medications Prior to  Admission  Medication Sig Dispense Refill Last Dose  . Prenatal Vit-Fe Fumarate-FA (MULTIVITAMIN-PRENATAL) 27-0.8 MG TABS tablet Take 1 tablet by mouth daily. 100 each 3 02/10/2018 at Unknown time      Review of Systems: ROS  Breathing hard w/ ctx, tired. No NV/HA No LOF/VB + FM   Physical Exam:  Dilation: 4 Effacement (%): 90 Station: -1 Exam by:: Derrell Lolling CNM Vitals:   02/23/18 0931  BP: 105/75  Pulse: 94  Resp: 18  Temp: 98.1 F (36.7 C)  SpO2: 100%    General: AAO x 3, working hard with ctx, not able to relax between them Heart: RRR Lungs:CTAB Abdomen:gravid, NT Extremities: no edema Genitalia / VE: no lesions, IBOW, ROT presentation FHR: 135, mod var, + accels, no decels TOCO: q 2 min, mild  Labs:    Recent Labs    02/23/18 0954  WBC 20.0*  HGB 13.2  HCT 38.8  PLT 337       Assessment:  32 y.o. G2P1001 at [redacted]w[redacted]d, previous C/S, planing TOLAC  1. Latent stage of labor 2. FHR category 2 3. GBS positive 4. Desires TOLAC, natural labor   Plan:  1. Admit to BS 2. Routine L&D orders 3. Therapeutic rest - Stadol/Phenergan, IV fluids 4. Expectant management for now  Dr Pamala Hurry notified of admission / plan of care   [place]-Carmen, MSN 02/23/2018, 10:19 AM

## 2018-02-24 ENCOUNTER — Encounter (HOSPITAL_COMMUNITY): Payer: Self-pay

## 2018-02-24 LAB — CBC
HEMATOCRIT: 34 % — AB (ref 36.0–46.0)
HEMOGLOBIN: 11.4 g/dL — AB (ref 12.0–15.0)
MCH: 29.6 pg (ref 26.0–34.0)
MCHC: 33.5 g/dL (ref 30.0–36.0)
MCV: 88.3 fL (ref 78.0–100.0)
Platelets: 330 10*3/uL (ref 150–400)
RBC: 3.85 MIL/uL — ABNORMAL LOW (ref 3.87–5.11)
RDW: 14.4 % (ref 11.5–15.5)
WBC: 18.4 10*3/uL — ABNORMAL HIGH (ref 4.0–10.5)

## 2018-02-24 LAB — RPR: RPR: NONREACTIVE

## 2018-02-24 NOTE — Progress Notes (Signed)
PPD # 1 SVD Information for the patient's newborn:  Annelisa, Ryback Girl Taisley [[id number]]  female      breast feeding   Baby name: Renata Caprice  S:  Reports feeling very good, some perineal and rectal soreness.             Tolerating po/ No nausea or vomiting             Bleeding is light             Pain controlled with ibuprofen (OTC)             Up ad lib / ambulatory / voiding without difficulties        O:  A & O x 3, in no apparent distress              VS:  Vitals:   02/23/18 2127 02/23/18 2235 02/24/18 0240 02/24/18 0612  BP: 114/75 117/64 108/62 102/62  Pulse: 92 81 70 67  Resp:  18 18 18   Temp: 98.5 F (36.9 C) 98 F (36.7 C) 98.2 F (36.8 C) 98.2 F (36.8 C)  TempSrc:    Oral  SpO2: 97% 98% 98% 97%  Weight:      Height:        LABS:  Recent Labs    02/23/18 0954 02/24/18 0513  WBC 20.0* 18.4*  HGB 13.2 11.4*  HCT 38.8 34.0*  PLT 337 330    Blood type: --/--/O POS (05/25 0954)  Rubella: Immune (10/29 0000)   I&O: I/O last 3 completed shifts: In: -  Out: 150 [Blood:150]          No intake/output data recorded.  Lungs: Clear and unlabored  Heart: regular rate and rhythm / no murmurs  Abdomen: soft, non-tender, non-distended             Fundus: firm, non-tender, U-1  Perineum: repair intact, mild ecchymoses   Lochia: scant  Extremities: no edema, no calf pain or tenderness    A/P: PPD # 1 32 y.o., [id number]   Principal Problem:   VBAC 5/25 Active Problems:   Postpartum care following vaginal delivery   Second-degree perineal laceration, with delivery   Doing well - stable status  Routine post partum orders  Anticipate discharge tomorrow    Juliene Pina, MSN, CNM 02/24/2018, 10:42 AM

## 2018-02-24 NOTE — Lactation Note (Signed)
This note was copied from a baby's chart. Lactation Consultation Note  Patient Name: Girl Conchetta Lamia KGURK'Y Date: 02/24/2018 Reason for consult: Initial assessment;Term Breastfeeding consultation services and support information given to patient.  This is mom's second baby and newborn is 46 hours old.  Mom reports baby is latching easily and feedings are comfortable.  Mom states nipples became sore with first baby due to poor latch.  Instructed to watch baby for cues and call for assist prn.  Maternal Data    Feeding Feeding Type: Breast Fed Length of feed: 19 min  LATCH Score                   Interventions    Lactation Tools Discussed/Used     Consult Status Consult Status: Follow-up Date: 02/25/18 Follow-up type: In-patient    Ave Filter 02/24/2018, 1:20 PM

## 2018-02-25 MED ORDER — IBUPROFEN 600 MG PO TABS: 600.0000 mg | ORAL_TABLET | Freq: Four times a day (QID) | ORAL | 0 refills | Status: DC

## 2018-02-25 MED ORDER — HYDROCORTISONE ACE-PRAMOXINE 1-1 % RE FOAM: 1.0000 | Freq: Three times a day (TID) | RECTAL | 0 refills | Status: DC

## 2018-02-25 MED ORDER — HYDROCORTISONE ACE-PRAMOXINE 1-1 % RE FOAM
1.0000 | Freq: Three times a day (TID) | RECTAL | Status: DC
  Administered 2018-02-25 (×2): 1 via RECTAL
  Filled 2018-02-25 (×2): qty 10

## 2018-02-25 NOTE — Discharge Summary (Signed)
Obstetric Discharge Summary   Patient Name: Jessica Schwartz DOB: 11/12/1985 MRN: [id number]  Date of Admission: 02/23/2018 Date of Discharge: 02/25/2018 Date of Delivery: 02/23/18 Gestational Age at Delivery: [redacted]w[redacted]d  Primary OB: [facility] - CNM management: Emelda Fear, CNM   Antepartum complications:  hx C/S, arrest of descent and failed vacuum, positive GBS UTI, treated  Prenatal Labs:  ABO, Rh: O (10/29 0000)  Antibody: Negative (10/29 0000) Rubella: Immune (10/29 0000)  RPR: Nonreactive (10/29 0000)  HBsAg: Negative (10/29 0000)  HIV: Non-reactive (10/29 0000)  GBS:   positive 1 hr Glucola : 88 Ultrascreen negative Normal anatomy, posterior placenta, XX EFW 7.5 lbs per Leopold's  Admitting Diagnosis: Labor at term   Secondary Diagnoses: Patient Active Problem List   Diagnosis Date Noted  . VBAC 5/25 02/23/2018  . Postpartum care following vaginal delivery 02/23/2018  . Second-degree perineal laceration, with delivery 02/23/2018  . Abdominal pain during pregnancy in second trimester 09/21/2017    Augmentation: None Complications: None  Date of Delivery: 02/23/18 Delivered By: D. Eddie Dibbles, CNM  Delivery Type: vaginal birth after cesarean (VBAC) Anesthesia: nitrous; sterile water papules  Placenta: sponatneous Laceration: 2nd degree perineal  Episiotomy: none  Newborn Data: Live born female  Birth Weight: 7 lb 5.1 oz (3320 g) APGAR: 7, 9  Newborn Delivery   Birth date/time:  02/23/2018 19:42:00 Delivery type:  VBAC, Spontaneous     Hospital/Postpartum Course  (Vaginal Delivery): Pt. Admitted at 39+6 weeks with intense contractions - had been having prodromal labor for several days prior to.  She had therapeutic rest, then progressed spontaneously. See notes for details  Patient had an uncomplicated postpartum course.  By time of discharge on PPD#2, her pain was controlled on oral pain medications; she had appropriate lochia and was ambulating, voiding  without difficulty and tolerating regular diet.  She was deemed stable for discharge to home.     Labs: CBC Latest Ref Rng & Units 02/24/2018 02/23/2018 06/23/2017  WBC 4.0 - 10.5 K/uL 18.4(H) 20.0(H) 6.2  Hemoglobin 12.0 - 15.0 g/dL 11.4(L) 13.2 12.6  Hematocrit 36.0 - 46.0 % 34.0(L) 38.8 36.8  Platelets 150 - 400 K/uL 330 337 298   O POS  Physical exam:  BP 90/78   Pulse 68   Temp 97.8 F (36.6 C)   Resp 18   Ht 5\' 4"  (1.626 m)   Wt 64.9 kg (143 lb)   LMP 05/20/2017 (Approximate)   SpO2 99%   Breastfeeding? Unknown   BMI 24.55 kg/m  General: alert and no distress Pulm: normal respiratory effort Lochia: appropriate Abdomen: soft, NT Uterine Fundus: firm, below umbilicus Perineum: well approximated 2nd degree laceration, +mild edema noted; +ecchymosis noted with hemorrhoids, but no evidence of thrombosis, no erythema  Extremities: No evidence of DVT seen on physical exam. No lower extremity edema.   Disposition: stable, discharge to home Baby Feeding: breast milk Baby Disposition: home with mom  Contraception: unsure  Rh Immune globulin given: N/A Rubella vaccine given: N/A Tdap vaccine given in AP or PP setting: UTD Flu vaccine given in AP or PP setting: UTD   Plan:  AYUSHI PLA was discharged to home in good condition. Follow-up appointment at [facility] in 6 weeks.  Discharge Instructions: Per After Visit Summary. Refer to After Visit Summary and [facility] discharge booklet  Activity: Advance as tolerated. Pelvic rest for 6 weeks.   Diet: Regular, Heart Healthy Discharge Medications: Allergies as of 02/25/2018   No Known Allergies  Medication List    TAKE these medications   hydrocortisone-pramoxine rectal foam Commonly known as:  PROCTOFOAM-HC Place 1 applicator rectally 3 (three) times daily.   ibuprofen 600 MG tablet Commonly known as:  ADVIL,MOTRIN Take 1 tablet (600 mg total) by mouth every 6 (six) hours.    multivitamin-prenatal 27-0.8 MG Tabs tablet Take 1 tablet by mouth daily.            Discharge Care Instructions  (From admission, onward)        Start     Ordered   02/25/18 0000  Discharge wound care:    Comments:  Alternate 2nd degree repair with sitz baths and ice packs   02/25/18 1153     Outpatient follow up:  Follow-up Information    Juliene Pina, CNM. Schedule an appointment as soon as possible for a visit in 6 week(s).   Specialty:  Obstetrics and Gynecology Why:  Postpartum visit Contact information: [address] [place] [address] [phone]           Signed:  Lars Pinks, MSN, CNM [facility]

## 2018-02-25 NOTE — Progress Notes (Signed)
PPD #2, VBAC, 2nd degree repair, baby girl "[place]"  S:  Reports feeling much better today; states the recovery has been going better than her c/s.              Tolerating po/ No nausea or vomiting / Denies dizziness or SOB             Bleeding is light             Pain controlled with MOtrin - mild discomfort from repair and hemorrhoid              Up ad lib / ambulatory / voiding QS  Newborn breast feeding - going well per patient   O:               VS: BP 90/78   Pulse 68   Temp 97.8 F (36.6 C)   Resp 18   Ht 5\' 4"  (1.626 m)   Wt 64.9 kg (143 lb)   LMP 05/20/2017 (Approximate)   SpO2 99%   Breastfeeding? Unknown   BMI 24.55 kg/m    LABS:              Recent Labs    02/23/18 0954 02/24/18 0513  WBC 20.0* 18.4*  HGB 13.2 11.4*  PLT 337 330               Blood type: --/--/O POS (05/25 0954)  Rubella: Immune (10/29 0000)                     I&O: Intake/Output      05/26 0701 - 05/27 0700 05/27 0701 - 05/28 0700   Urine (mL/kg/hr)     Blood     Total Output     Net                        Physical Exam:             Alert and oriented X3  Lungs: Clear and unlabored  Heart: regular rate and rhythm / no murmurs  Abdomen: soft, non-tender, non-distended              Fundus: firm, non-tender, U-3  Perineum: well approximated 2nd degree laceration, +mild edema noted; +ecchymosis noted with hemorrhoids, but no evidence of thrombosis, no erythema   Lochia: small, no clots   Extremities: no edema, no calf pain or tenderness    A: PPD # 1, VBAC  2nd degree perineal repair    External hemorrhoid   Doing well - stable status  P: Routine post partum orders  Proctofoam TID  Recommend alternating ice packs and sitz baths   Discharge home today   WOB discharge book and instructions reviewed   F/u in 6 weeks for PP visit   Lars Pinks, MSN, CNM [facility]

## 2018-02-25 NOTE — Lactation Note (Signed)
This note was copied from a baby's chart. Lactation Consultation Note  Patient Name: Jessica Schwartz [id number] Date: 02/25/2018 Reason for consult: Follow-up assessment  Infant is nursing very well (suck: swallow ratio of 1:1 noted). R nipple noted to be intact (infant nursing on L side during consult). Mom comfortable w/latch.  Parents' questions answered.  Matthias Hughs [facility] 02/25/2018, 8:25 AM

## 2018-05-07 DIAGNOSIS — Z3009 Encounter for other general counseling and advice on contraception: Secondary | ICD-10-CM | POA: Diagnosis not present

## 2018-05-07 DIAGNOSIS — Z3202 Encounter for pregnancy test, result negative: Secondary | ICD-10-CM | POA: Diagnosis not present

## 2018-05-07 DIAGNOSIS — Z30430 Encounter for insertion of intrauterine contraceptive device: Secondary | ICD-10-CM | POA: Diagnosis not present

## 2018-06-10 DIAGNOSIS — Z30431 Encounter for routine checking of intrauterine contraceptive device: Secondary | ICD-10-CM | POA: Diagnosis not present

## 2018-06-17 ENCOUNTER — Encounter: Payer: Self-pay | Admitting: Family Medicine

## 2018-07-09 ENCOUNTER — Encounter: Payer: 59 | Admitting: Family Medicine

## 2018-08-05 DIAGNOSIS — M9903 Segmental and somatic dysfunction of lumbar region: Secondary | ICD-10-CM | POA: Diagnosis not present

## 2018-08-06 DIAGNOSIS — D3131 Benign neoplasm of right choroid: Secondary | ICD-10-CM | POA: Diagnosis not present

## 2018-08-06 DIAGNOSIS — M9903 Segmental and somatic dysfunction of lumbar region: Secondary | ICD-10-CM | POA: Diagnosis not present

## 2018-08-07 DIAGNOSIS — Z6821 Body mass index (BMI) 21.0-21.9, adult: Secondary | ICD-10-CM | POA: Diagnosis not present

## 2018-08-07 DIAGNOSIS — Z01419 Encounter for gynecological examination (general) (routine) without abnormal findings: Secondary | ICD-10-CM | POA: Diagnosis not present

## 2018-08-07 DIAGNOSIS — Z1151 Encounter for screening for human papillomavirus (HPV): Secondary | ICD-10-CM | POA: Diagnosis not present

## 2018-08-07 DIAGNOSIS — M9903 Segmental and somatic dysfunction of lumbar region: Secondary | ICD-10-CM | POA: Diagnosis not present

## 2018-08-08 DIAGNOSIS — M9903 Segmental and somatic dysfunction of lumbar region: Secondary | ICD-10-CM | POA: Diagnosis not present

## 2018-08-12 DIAGNOSIS — M9903 Segmental and somatic dysfunction of lumbar region: Secondary | ICD-10-CM | POA: Diagnosis not present

## 2019-01-27 ENCOUNTER — Other Ambulatory Visit: Payer: Self-pay

## 2019-01-27 ENCOUNTER — Encounter: Payer: 59 | Admitting: Family Medicine

## 2019-03-18 ENCOUNTER — Ambulatory Visit (INDEPENDENT_AMBULATORY_CARE_PROVIDER_SITE_OTHER): Payer: 59 | Admitting: Family Medicine

## 2019-03-18 ENCOUNTER — Encounter: Payer: Self-pay | Admitting: Family Medicine

## 2019-03-18 ENCOUNTER — Other Ambulatory Visit: Payer: Self-pay

## 2019-03-18 VITALS — BP 93/58 | HR 82 | Temp 98.2°F | Resp 17 | Ht 64.25 in | Wt 115.2 lb

## 2019-03-18 DIAGNOSIS — Z131 Encounter for screening for diabetes mellitus: Secondary | ICD-10-CM

## 2019-03-18 DIAGNOSIS — N920 Excessive and frequent menstruation with regular cycle: Secondary | ICD-10-CM

## 2019-03-18 DIAGNOSIS — R5383 Other fatigue: Secondary | ICD-10-CM

## 2019-03-18 DIAGNOSIS — Z13220 Encounter for screening for lipoid disorders: Secondary | ICD-10-CM | POA: Diagnosis not present

## 2019-03-18 DIAGNOSIS — E876 Hypokalemia: Secondary | ICD-10-CM | POA: Diagnosis not present

## 2019-03-18 DIAGNOSIS — D649 Anemia, unspecified: Secondary | ICD-10-CM | POA: Diagnosis not present

## 2019-03-18 DIAGNOSIS — E559 Vitamin D deficiency, unspecified: Secondary | ICD-10-CM | POA: Diagnosis not present

## 2019-03-18 DIAGNOSIS — Z975 Presence of (intrauterine) contraceptive device: Secondary | ICD-10-CM | POA: Diagnosis not present

## 2019-03-18 DIAGNOSIS — Z0000 Encounter for general adult medical examination without abnormal findings: Secondary | ICD-10-CM

## 2019-03-18 LAB — COMPREHENSIVE METABOLIC PANEL
ALT: 22 U/L (ref 0–35)
AST: 21 U/L (ref 0–37)
Albumin: 4.3 g/dL (ref 3.5–5.2)
Alkaline Phosphatase: 56 U/L (ref 39–117)
BUN: 13 mg/dL (ref 6–23)
CO2: 27 mEq/L (ref 19–32)
Calcium: 8.7 mg/dL (ref 8.4–10.5)
Chloride: 106 mEq/L (ref 96–112)
Creatinine, Ser: 0.6 mg/dL (ref 0.40–1.20)
GFR: 114.93 mL/min (ref >60.00)
Glucose, Bld: 75 mg/dL (ref 70–99)
Potassium: 4.5 mEq/L (ref 3.5–5.1)
Sodium: 138 mEq/L (ref 135–145)
Total Bilirubin: 1.2 mg/dL (ref 0.2–1.2)
Total Protein: 6.2 g/dL (ref 6.0–8.3)

## 2019-03-18 LAB — LIPID PANEL
Cholesterol: 145 mg/dL (ref 0–200)
HDL: 66.1 mg/dL (ref >39.00)
LDL Cholesterol: 73 mg/dL (ref 0–99)
NonHDL: 78.83
Total CHOL/HDL Ratio: 2
Triglycerides: 29 mg/dL (ref 0.0–149.0)
VLDL: 5.8 mg/dL (ref 0.0–40.0)

## 2019-03-18 LAB — CBC
HCT: 38 % (ref 36.0–46.0)
Hemoglobin: 12.6 g/dL (ref 12.0–15.0)
MCHC: 33.2 g/dL (ref 30.0–36.0)
MCV: 91.2 fl (ref 78.0–100.0)
Platelets: 302 10*3/uL (ref 150.0–400.0)
RBC: 4.17 Mil/uL (ref 3.87–5.11)
RDW: 13.7 % (ref 11.5–15.5)
WBC: 4.2 10*3/uL (ref 4.0–10.5)

## 2019-03-18 LAB — VITAMIN D 25 HYDROXY (VIT D DEFICIENCY, FRACTURES): VITD: 29.63 ng/mL — ABNORMAL LOW (ref 30.00–100.00)

## 2019-03-18 LAB — TSH: TSH: 0.6 u[IU]/mL (ref 0.35–4.50)

## 2019-03-18 LAB — HEMOGLOBIN A1C: Hgb A1c MFr Bld: 5.5 % (ref 4.6–6.5)

## 2019-03-18 NOTE — Patient Instructions (Signed)
Health Maintenance, Female Adopting a healthy lifestyle and getting preventive care can go a long way to promote health and wellness. Talk with your health care provider about what schedule of regular examinations is right for you. This is a good chance for you to check in with your provider about disease prevention and staying healthy. In between checkups, there are plenty of things you can do on your own. Experts have done a lot of research about which lifestyle changes and preventive measures are most likely to keep you healthy. Ask your health care provider for more information. Weight and diet Eat a healthy diet  Be sure to include plenty of vegetables, fruits, low-fat dairy products, and lean protein.  Do not eat a lot of foods high in solid fats, added sugars, or salt.  Get regular exercise. This is one of the most important things you can do for your health. ? Most adults should exercise for at least 150 minutes each week. The exercise should increase your heart rate and make you sweat (moderate-intensity exercise). ? Most adults should also do strengthening exercises at least twice a week. This is in addition to the moderate-intensity exercise. Maintain a healthy weight  Body mass index (BMI) is a measurement that can be used to identify possible weight problems. It estimates body fat based on height and weight. Your health care provider can help determine your BMI and help you achieve or maintain a healthy weight.  For females 20 years of age and older: ? A BMI below 18.5 is considered underweight. ? A BMI of 18.5 to 24.9 is normal. ? A BMI of 25 to 29.9 is considered overweight. ? A BMI of 30 and above is considered obese. Watch levels of cholesterol and blood lipids  You should start having your blood tested for lipids and cholesterol at 33 years of age, then have this test every 5 years.  You may need to have your cholesterol levels checked more often if: ? Your lipid or  cholesterol levels are high. ? You are older than 33 years of age. ? You are at high risk for heart disease. Cancer screening Lung Cancer  Lung cancer screening is recommended for adults 55-80 years old who are at high risk for lung cancer because of a history of smoking.  A yearly low-dose CT scan of the lungs is recommended for people who: ? Currently smoke. ? Have quit within the past 15 years. ? Have at least a 30-pack-year history of smoking. A pack year is smoking an average of one pack of cigarettes a day for 1 year.  Yearly screening should continue until it has been 15 years since you quit.  Yearly screening should stop if you develop a health problem that would prevent you from having lung cancer treatment. Breast Cancer  Practice breast self-awareness. This means understanding how your breasts normally appear and feel.  It also means doing regular breast self-exams. Let your health care provider know about any changes, no matter how small.  If you are in your 20s or 30s, you should have a clinical breast exam (CBE) by a health care provider every 1-3 years as part of a regular health exam.  If you are 40 or older, have a CBE every year. Also consider having a breast X-ray (mammogram) every year.  If you have a family history of breast cancer, talk to your health care provider about genetic screening.  If you are at high risk for breast cancer, talk   to your health care provider about having an MRI and a mammogram every year.  Breast cancer gene (BRCA) assessment is recommended for women who have family members with BRCA-related cancers. BRCA-related cancers include: ? Breast. ? Ovarian. ? Tubal. ? Peritoneal cancers.  Results of the assessment will determine the need for genetic counseling and BRCA1 and BRCA2 testing. Cervical Cancer Your health care provider may recommend that you be screened regularly for cancer of the pelvic organs (ovaries, uterus, and vagina).  This screening involves a pelvic examination, including checking for microscopic changes to the surface of your cervix (Pap test). You may be encouraged to have this screening done every 3 years, beginning at age 21.  For women ages 30-65, health care providers may recommend pelvic exams and Pap testing every 3 years, or they may recommend the Pap and pelvic exam, combined with testing for human papilloma virus (HPV), every 5 years. Some types of HPV increase your risk of cervical cancer. Testing for HPV may also be done on women of any age with unclear Pap test results.  Other health care providers may not recommend any screening for nonpregnant women who are considered low risk for pelvic cancer and who do not have symptoms. Ask your health care provider if a screening pelvic exam is right for you.  If you have had past treatment for cervical cancer or a condition that could lead to cancer, you need Pap tests and screening for cancer for at least 20 years after your treatment. If Pap tests have been discontinued, your risk factors (such as having a new sexual partner) need to be reassessed to determine if screening should resume. Some women have medical problems that increase the chance of getting cervical cancer. In these cases, your health care provider may recommend more frequent screening and Pap tests. Colorectal Cancer  This type of cancer can be detected and often prevented.  Routine colorectal cancer screening usually begins at 33 years of age and continues through 33 years of age.  Your health care provider may recommend screening at an earlier age if you have risk factors for colon cancer.  Your health care provider may also recommend using home test kits to check for hidden blood in the stool.  A small camera at the end of a tube can be used to examine your colon directly (sigmoidoscopy or colonoscopy). This is done to check for the earliest forms of colorectal cancer.  Routine  screening usually begins at age 50.  Direct examination of the colon should be repeated every 5-10 years through 33 years of age. However, you may need to be screened more often if early forms of precancerous polyps or small growths are found. Skin Cancer  Check your skin from head to toe regularly.  Tell your health care provider about any new moles or changes in moles, especially if there is a change in a mole's shape or color.  Also tell your health care provider if you have a mole that is larger than the size of a pencil eraser.  Always use sunscreen. Apply sunscreen liberally and repeatedly throughout the day.  Protect yourself by wearing long sleeves, pants, a wide-brimmed hat, and sunglasses whenever you are outside. Heart disease, diabetes, and high blood pressure  High blood pressure causes heart disease and increases the risk of stroke. High blood pressure is more likely to develop in: ? People who have blood pressure in the high end of the normal range (130-139/85-89 mm Hg). ? People   who are overweight or obese. ? People who are African American.  If you are 84-22 years of age, have your blood pressure checked every 3-5 years. If you are 67 years of age or older, have your blood pressure checked every year. You should have your blood pressure measured twice-once when you are at a hospital or clinic, and once when you are not at a hospital or clinic. Record the average of the two measurements. To check your blood pressure when you are not at a hospital or clinic, you can use: ? An automated blood pressure machine at a pharmacy. ? A home blood pressure monitor.  If you are between 52 years and 3 years old, ask your health care provider if you should take aspirin to prevent strokes.  Have regular diabetes screenings. This involves taking a blood sample to check your fasting blood sugar level. ? If you are at a normal weight and have a low risk for diabetes, have this test once  every three years after 33 years of age. ? If you are overweight and have a high risk for diabetes, consider being tested at a younger age or more often. Preventing infection Hepatitis B  If you have a higher risk for hepatitis B, you should be screened for this virus. You are considered at high risk for hepatitis B if: ? You were born in a country where hepatitis B is common. Ask your health care provider which countries are considered high risk. ? Your parents were born in a high-risk country, and you have not been immunized against hepatitis B (hepatitis B vaccine). ? You have HIV or AIDS. ? You use needles to inject street drugs. ? You live with someone who has hepatitis B. ? You have had sex with someone who has hepatitis B. ? You get hemodialysis treatment. ? You take certain medicines for conditions, including cancer, organ transplantation, and autoimmune conditions. Hepatitis C  Blood testing is recommended for: ? Everyone born from [birth year] through 1965. ? Anyone with known risk factors for hepatitis C. Sexually transmitted infections (STIs)  You should be screened for sexually transmitted infections (STIs) including gonorrhea and chlamydia if: ? You are sexually active and are younger than 33 years of age. ? You are older than 33 years of age and your health care provider tells you that you are at risk for this type of infection. ? Your sexual activity has changed since you were last screened and you are at an increased risk for chlamydia or gonorrhea. Ask your health care provider if you are at risk.  If you do not have HIV, but are at risk, it may be recommended that you take a prescription medicine daily to prevent HIV infection. This is called pre-exposure prophylaxis (PrEP). You are considered at risk if: ? You are sexually active and do not regularly use condoms or know the HIV status of your partner(s). ? You take drugs by injection. ? You are sexually active with a partner  who has HIV. Talk with your health care provider about whether you are at high risk of being infected with HIV. If you choose to begin PrEP, you should first be tested for HIV. You should then be tested every 3 months for as long as you are taking PrEP. Pregnancy  If you are premenopausal and you may become pregnant, ask your health care provider about preconception counseling.  If you may become pregnant, take 400 to 800 micrograms (mcg) of folic acid every  day.  If you want to prevent pregnancy, talk to your health care provider about birth control (contraception). Osteoporosis and menopause  Osteoporosis is a disease in which the bones lose minerals and strength with aging. This can result in serious bone fractures. Your risk for osteoporosis can be identified using a bone density scan.  If you are 65 years of age or older, or if you are at risk for osteoporosis and fractures, ask your health care provider if you should be screened.  Ask your health care provider whether you should take a calcium or vitamin D supplement to lower your risk for osteoporosis.  Menopause may have certain physical symptoms and risks.  Hormone replacement therapy may reduce some of these symptoms and risks. Talk to your health care provider about whether hormone replacement therapy is right for you. Follow these instructions at home:  Schedule regular health, dental, and eye exams.  Stay current with your immunizations.  Do not use any tobacco products including cigarettes, chewing tobacco, or electronic cigarettes.  If you are pregnant, do not drink alcohol.  If you are breastfeeding, limit how much and how often you drink alcohol.  Limit alcohol intake to no more than 1 drink per day for nonpregnant women. One drink equals 12 ounces of beer, 5 ounces of wine, or 1 ounces of hard liquor.  Do not use street drugs.  Do not share needles.  Ask your health care provider for help if you need support  or information about quitting drugs.  Tell your health care provider if you often feel depressed.  Tell your health care provider if you have ever been abused or do not feel safe at home. This information is not intended to replace advice given to you by your health care provider. Make sure you discuss any questions you have with your health care provider. Document Released: 04/03/2011 Document Revised: 02/24/2016 Document Reviewed: 06/22/2015 Elsevier Interactive Patient Education  2019 [employer]   Please help us help you:  We are honored you have chosen [facility] for your Primary Care home. Below you will find basic instructions that you may need to access in the future. Please help us help you by reading the instructions, which cover many of the frequent questions we experience.   Prescription refills and request:  -In order to allow more efficient response time, please call your pharmacy for all refills. They will forward the request electronically to us. This allows for the quickest possible response. Request left on a nurse line can take longer to refill, since these are checked as time allows between office patients and other phone calls.  - refill request can take up to 3-5 working days to complete.  - If request is sent electronically and request is appropiate, it is usually completed in 1-2 business days.  - all patients will need to be seen routinely for all chronic medical conditions requiring prescription medications (see follow-up below). If you are overdue for follow up on your condition, you will be asked to make an appointment and we will call in enough medication to cover you until your appointment (up to 30 days).  - all controlled substances will require a face to face visit to request/refill.  - if you desire your prescriptions to go through a new pharmacy, and have an active script at original pharmacy, you will need to call your pharmacy and have scripts  transferred to new pharmacy. This is completed between the pharmacy locations and not   by your provider.    Results: If any images or labs were ordered, it can take up to 1 week to get results depending on the test ordered and the lab/facility running and resulting the test. - Normal or stable results, which do not need further discussion, may be released to your mychart immediately with attached note to you. A call may not be generated for normal results. Please make certain to sign up for mychart. If you have questions on how to activate your mychart you can call the front office.  - If your results need further discussion, our office will attempt to contact you via phone, and if unable to reach you after 2 attempts, we will release your abnormal result to your mychart with instructions.  - All results will be automatically released in mychart after 1 week.  - Your provider will provide you with explanation and instruction on all relevant material in your results. Please keep in mind, results and labs may appear confusing or abnormal to the untrained eye, but it does not mean they are actually abnormal for you personally. If you have any questions about your results that are not covered, or you desire more detailed explanation than what was provided, you should make an appointment with your provider to do so.   Our office handles many outgoing and incoming calls daily. If we have not contacted you within 1 week about your results, please check your mychart to see if there is a message first and if not, then contact our office.  In helping with this matter, you help decrease call volume, and therefore allow [place] to be able to respond to patients needs more efficiently.   Acute office visits (sick visit):  An acute visit is intended for a new problem and are scheduled in shorter time slots to allow schedule openings for patients with new problems. This is the appropriate visit to discuss a new problem.  Problems will not be addressed by phone call or Echart message. Appointment is needed if requesting treatment. In order to provide you with excellent quality medical care with proper time for you to explain your problem, have an exam and receive treatment with instructions, these appointments should be limited to one new problem per visit. If you experience a new problem, in which you desire to be addressed, please make an acute office visit, we save openings on the schedule to accommodate you. Please do not save your new problem for any other type of visit, let us take care of it properly and quickly for you.   Follow up visits:  Depending on your condition(s) your provider will need to see you routinely in order to provide you with quality care and prescribe medication(s). Most chronic conditions (Example: hypertension, Diabetes, depression/anxiety... etc), require visits a couple times a year. Your provider will instruct you on proper follow up for your personal medical conditions and history. Please make certain to make follow up appointments for your condition as instructed. Failing to do so could result in lapse in your medication treatment/refills. If you request a refill, and are overdue to be seen on a condition, we will always provide you with a 30 day script (once) to allow you time to schedule.    Medicare wellness (well visit): - we have a wonderful Nurse Maudie Mercury), that will meet with you and provide you will yearly medicare wellness visits. These visits should occur yearly (can not be scheduled less than 1 calendar year apart) and cover  preventive health, immunizations, advance directives and screenings you are entitled to yearly through your medicare benefits. Do not miss out on your entitled benefits, this is when medicare will pay for these benefits to be ordered for you.  These are strongly encouraged by your provider and is the appropriate type of visit to make certain you are up to date with  all preventive health benefits. If you have not had your medicare wellness exam in the last 12 months, please make certain to schedule one by calling the office and schedule your medicare wellness with Kim as soon as possible.   Yearly physical (well visit):  - Adults are recommended to be seen yearly for physicals. Check with your insurance and date of your last physical, most insurances require one calendar year between physicals. Physicals include all preventive health topics, screenings, medical exam and labs that are appropriate for gender/age and history. You may have fasting labs needed at this visit. This is a well visit (not a sick visit), new problems should not be covered during this visit (see acute visit).  - Pediatric patients are seen more frequently when they are younger. Your provider will advise you on well child visit timing that is appropriate for your their age. - This is not a medicare wellness visit. Medicare wellness exams do not have an exam portion to the visit. Some medicare companies allow for a physical, some do not allow a yearly physical. If your medicare allows a yearly physical you can schedule the medicare wellness with our nurse Kim and have your physical with your provider after, on the same day. Please check with insurance for your full benefits.   Late Policy/No Shows:  - all new patients should arrive 15-30 minutes earlier than appointment to allow us time  to  obtain all personal demographics,  insurance information and for you to complete office paperwork. - All established patients should arrive 10-15 minutes earlier than appointment time to update all information and be checked in .  - In our best efforts to run on time, if you are late for your appointment you will be asked to either reschedule or if able, we will work you back into the schedule. There will be a wait time to work you back in the schedule,  depending on availability.  - If you are unable to make  it to your appointment as scheduled, please call 24 hours ahead of time to allow us to fill the time slot with someone else who needs to be seen. If you do not cancel your appointment ahead of time, you may be charged a no show fee.   

## 2019-03-18 NOTE — Progress Notes (Signed)
Patient ID: Jessica Schwartz, female  DOB: December 28, 1985, 33 y.o.   MRN: [id number] Patient Care Team    Relationship Specialty Notifications Start End  Ma Hillock, DO PCP - General Family Medicine  08/07/16   Paula Compton, MD Consulting Physician Obstetrics and Gynecology  08/07/16     Subjective:  Jessica Schwartz is a 33 y.o.  Female  present for CPE. All past medical history, surgical history, allergies, family history, immunizations, medications and social history were updated in the electronic medical record today. All recent labs, ED visits and hospitalizations within the last year were reviewed. Pt reports she has been more fatigued. She is breastfeeding her daughter and her daughter was found to have low iron. She endorses increased bleeding with her menstrual cycles since having the paraguard IUD placed. She has had a h/o iron deficiency anemia and vit d deficiency  in the past. She endorses fatigue, occasional palpitations and feeling more winded at times.   Health maintenance:  Colonoscopy: no fhx, screen at 17  Mammogram: [place] aunt  h/o breast CA. Early screen through GYN recommended. She completed mammogram 2015 for breast mass with bx>> benign fibrocystic changes. Cervical cancer screening: last pap: with in last 3 years records requested,  completed by: [facility]. H/o 1 HPV + and colpo, normal PAP since Immunizations: tdap 2017, Influenza (encouraged yearly) Infectious disease screening: HIV completed 2017 DEXA: N/A Assistive device: none  Oxygen JSH:FWYO Patient has a Dental home. Hospitalizations/ED visits: none  Depression screen [facility] 2/9 03/18/2019 08/28/2016 08/07/2016  Decreased Interest 0 0 0  Down, Depressed, Hopeless 0 0 0  PHQ - 2 Score 0 0 0     Immunization History  Administered Date(s) Administered  . Tdap 12/09/2015     History reviewed. No pertinent past medical history. No Known Allergies Past Surgical History:  Procedure  Laterality Date  . BREAST SURGERY     biopsy-fibrocystic only  . CESAREAN SECTION N/A 03/06/2016   Procedure: CESAREAN SECTION;  Surgeon: Cheri Fowler, MD;  Location: [facility];  Service: Obstetrics;  Laterality: N/A;  . WISDOM TOOTH EXTRACTION Bilateral 2008   Family History  Problem Relation Age of Onset  . Breast cancer Maternal Aunt 60  . Diabetes Maternal Grandmother   . Breast cancer Maternal Grandmother 60  . Heart attack Paternal Grandfather 18  . Hyperlipidemia Paternal Grandfather    Social History   Socioeconomic History  . Marital status: Married    Spouse name: [name]  . Number of children: 1  . Years of education: 13  . Highest education level: Not on file  Occupational History  . Occupation: Therapist, sports  Social Needs  . Financial resource strain: Not on file  . Food insecurity    Worry: Not on file    Inability: Not on file  . Transportation needs    Medical: Not on file    Non-medical: Not on file  Tobacco Use  . Smoking status: Never Smoker  . Smokeless tobacco: Never Used  Substance and Sexual Activity  . Alcohol use: No  . Drug use: No  . Sexual activity: Yes    Partners: Male    Birth control/protection: Condom  Lifestyle  . Physical activity    Days per week: Not on file    Minutes per session: Not on file  . Stress: Not on file  Relationships  . Social connections    Talks on phone: Not on file  Gets together: Not on file    Attends religious service: Not on file    Active member of club or organization: Not on file    Attends meetings of clubs or organizations: Not on file    Relationship status: Not on file  . Intimate partner violence    Fear of current or ex partner: Not on file    Emotionally abused: Not on file    Physically abused: Not on file    Forced sexual activity: Not on file  Other Topics Concern  . Not on file  Social History Narrative   Married to brian Thomann. 1 child jackson.    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    Takes a  daily vitamin   Wears her seatbelt, bicycle helmet, smoke detector in the home.   exercise routinely.    Feels safe in her relationships.    Allergies as of 03/18/2019   No Known Allergies     Medication List       Accurate as of March 18, 2019  9:19 AM. If you have any questions, ask your nurse or doctor.        STOP taking these medications   hydrocortisone-pramoxine rectal foam Commonly known as: PROCTOFOAM-HC Stopped by: Howard Pouch, DO   ibuprofen 600 MG tablet Commonly known as: ADVIL Stopped by: Howard Pouch, DO   multivitamin-prenatal 27-0.8 MG Tabs tablet Stopped by: Howard Pouch, DO     TAKE these medications   Fish Oil 500 MG Caps Take 1 capsule by mouth daily.        No results found for this or any previous visit (from the past 2160 hour(s)).  No results found.   ROS: 14 pt review of systems performed and negative (unless mentioned in an HPI)  Objective: BP (!) 93/58 (BP Location: Left Arm, Patient Position: Sitting, Cuff Size: Normal)   Pulse 82   Temp 98.2 F (36.8 C) (Temporal)   Resp 17   Ht 5' 4.25" (1.632 m)   Wt 115 lb 4 oz (52.3 kg)   LMP 03/16/2019 (Exact Date)   SpO2 99%   Breastfeeding Yes   BMI 19.63 kg/m  Gen: Afebrile. No acute distress. Nontoxic, pleasant, caucasian female.  HENT: AT. [place]. Bilateral TM visualized and normal in appearance. MMM. Bilateral nares . Throat without erythema or exudates. No cough or shortness of breath on exam.  Eyes:Pupils Equal Round Reactive to light, Extraocular movements intact,  Conjunctiva without redness, discharge or icterus. Neck/lymp/endocrine: Supple,no lymphadenopathy, no thyromegaly CV: RRR no murmur, no edema, +2/4 P posterior tibialis pulses Chest: CTAB, no wheeze or crackles Abd: Soft. flat. NTND. BS present. no Masses palpated.  Skin: no rashes, purpura or petechiae.  Neuro/msk:  Normal gait. PERLA. EOMi. Alert. Oriented. Cranial nerves II through XII intact. Muscle strength 5/5  bilateral U/L extremity. DTRs equal bilaterally. Psych: Normal affect, dress and demeanor. Normal speech. Normal thought content and judgment.   Assessment/plan: Jessica Schwartz is a 33 y.o. female present for CPE. Encounter for preventive health examination Patient was encouraged to exercise greater than 150 minutes a week. Patient was encouraged to choose a diet filled with fresh fruits and vegetables, and lean meats. AVS provided to patient today for education/recommendation on gender specific health and safety maintenance. Colonoscopy: no fhx, screen at 29  Mammogram: [place] aunt  h/o breast CA. Early screen through GYN recommended. She completed mammogram 2015 for breast mass with bx>> benign fibrocystic changes. Cervical cancer screening:  last pap: within 3 years records requested,  completed by[facility] H/o 1 HPV + and colpo, normal PAP since Immunizations: tdap 2017, Influenza (encouraged yearly) Infectious disease screening: HIV completed 2017 DEXA: N/A Lipid screening - Lipid panel Diabetes mellitus screening - Hemoglobin A1c Vitamin D deficiency - Vitamin D (25 hydroxy) Hypokalemia - taking PNV - Comprehensive metabolic panel  fatigue/Anemia, unspecified type - CBC - Comprehensive metabolic panel - TSH - Vitamin D (25 hydroxy) - Iron, TIBC and Ferritin Panel  Menorrhagia with regular cycle/IUD (intrauterine device) in place paraguard IUD causing increased menstrual flow- may be contributing to her fatigue.CBC and iron panel collected.   Return in about 1 year (around 03/17/2020) for CPE (30 min).    Electronically signed by: Howard Pouch, DO [place]

## 2019-03-19 LAB — IRON,TIBC AND FERRITIN PANEL
%SAT: 42 % (calc) (ref 16–45)
Ferritin: 29 ng/mL (ref 16–154)
Iron: 130 ug/dL (ref 40–190)
TIBC: 312 mcg/dL (calc) (ref 250–450)

## 2019-05-15 IMAGING — US US OB TRANSVAGINAL
1 series · 13 of 28 positions shown · non-contrast
Comparison: None.

CLINICAL DATA: Pelvic pain



[Series 1: us ob transvaginal · 0.23mm/px · 13 of 56 slices shown]
[im 3/56]
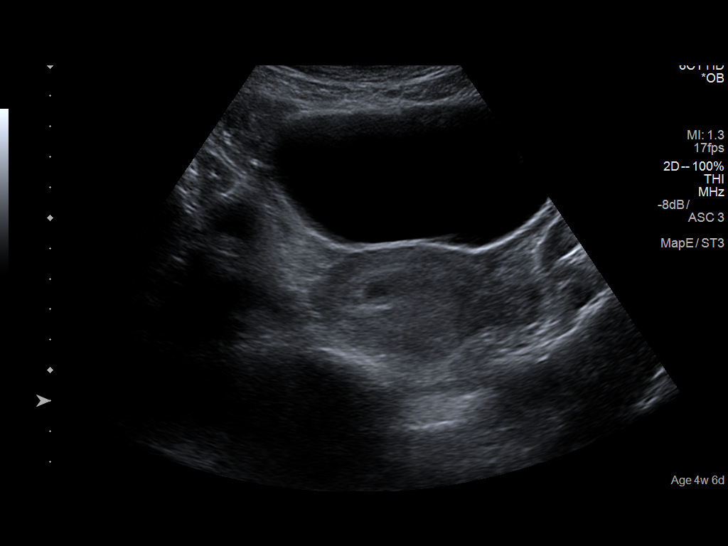
[im 7/56]
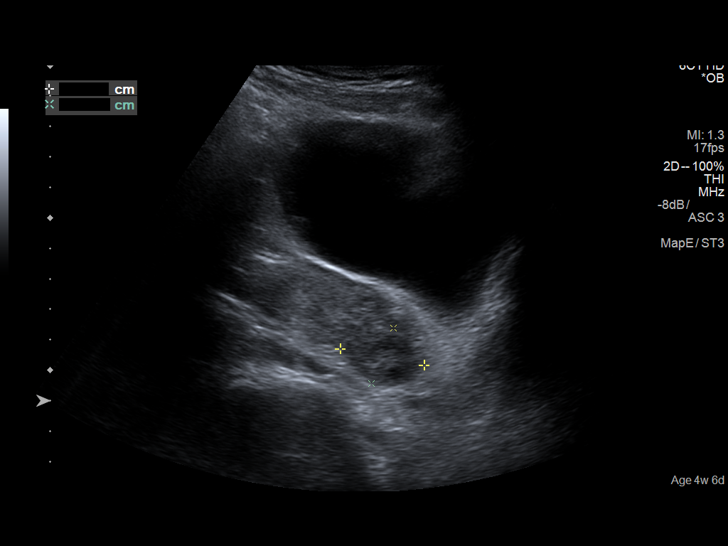
[im 11/56]
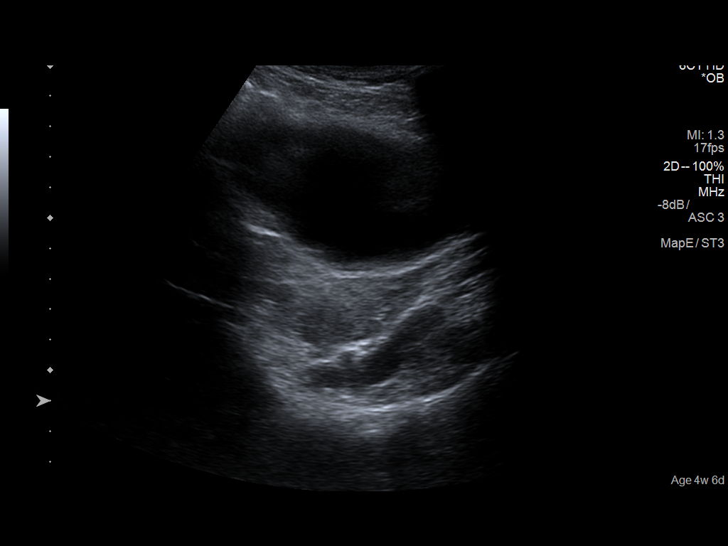
[im 15/56]
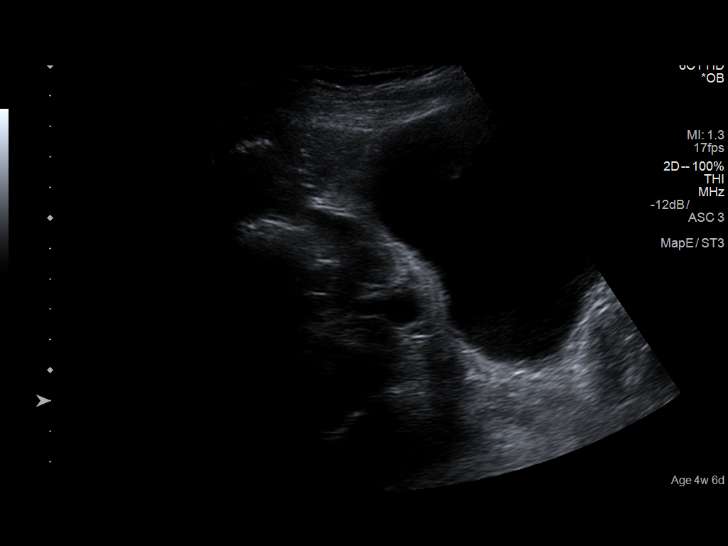
[im 19/56]
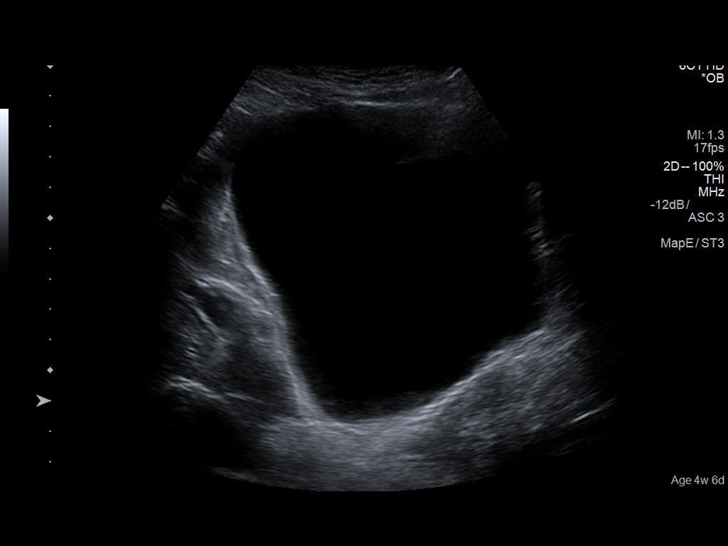
[im 23/56]
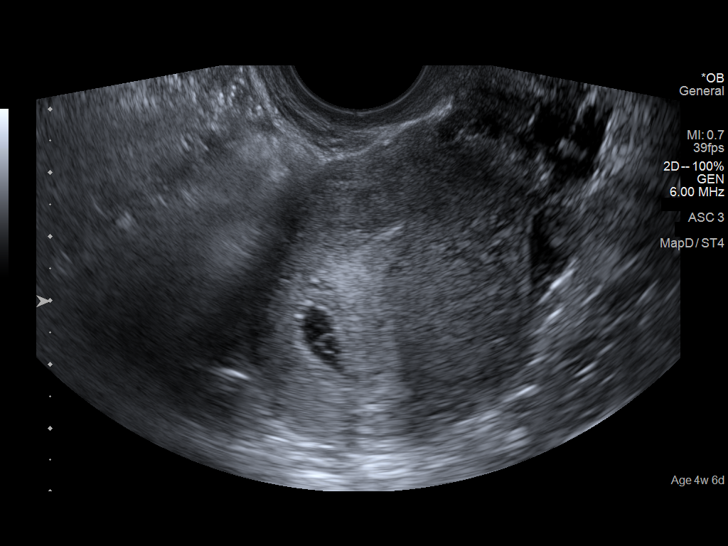
[im 29/56]
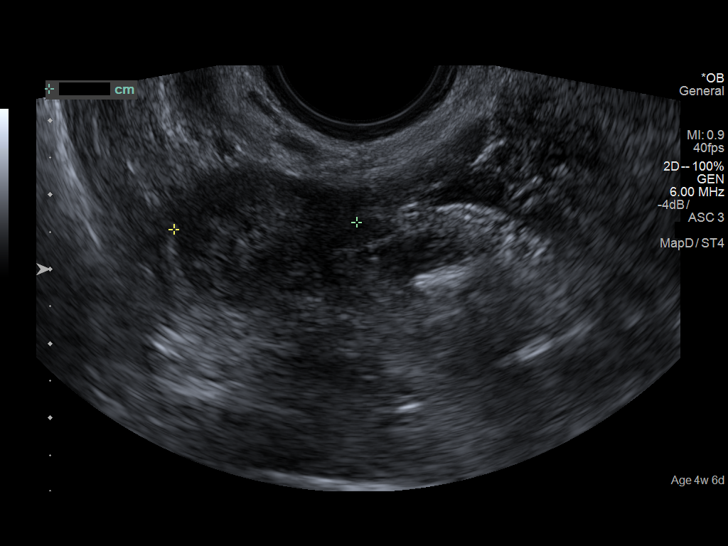
[im 33/56]
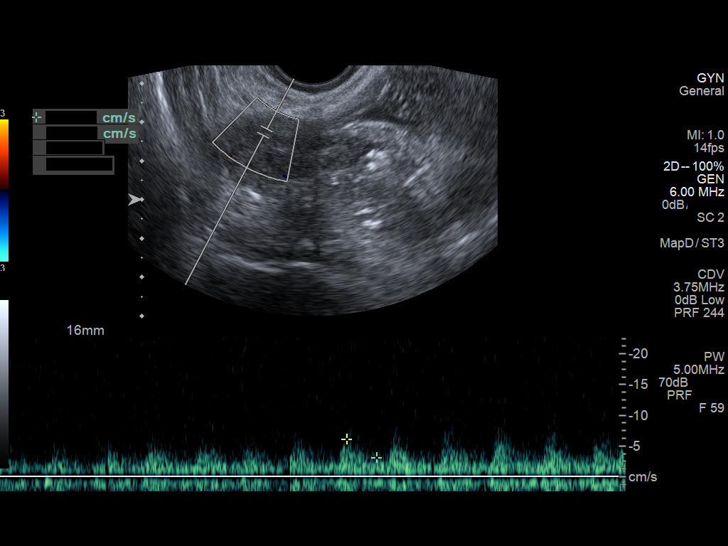
[im 37/56]
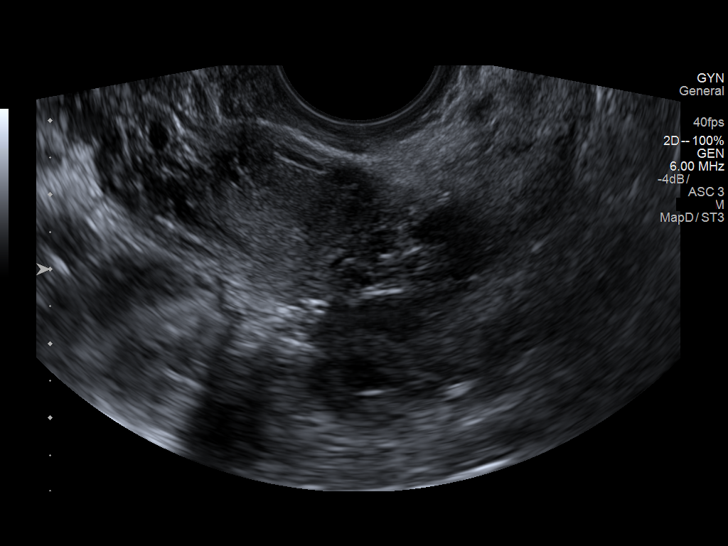
[im 41/56]
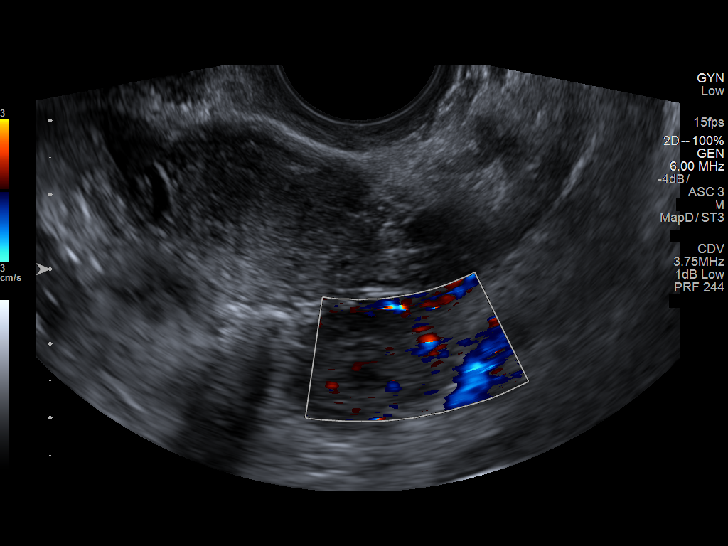
[im 45/56]
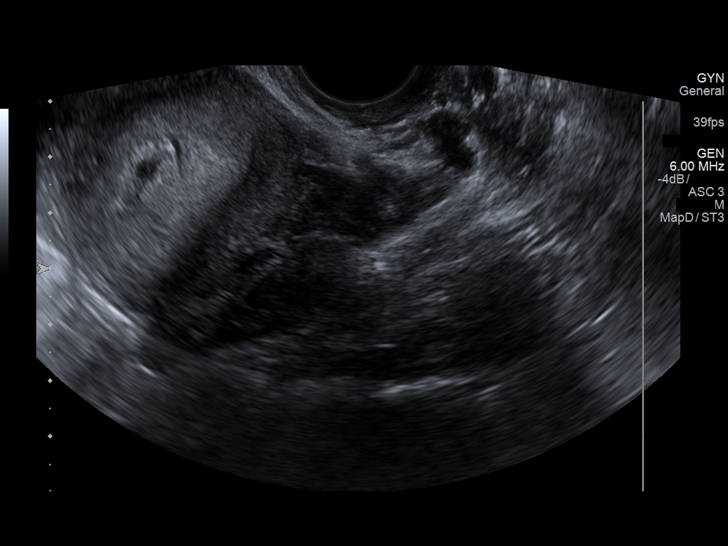
[im 49/56]
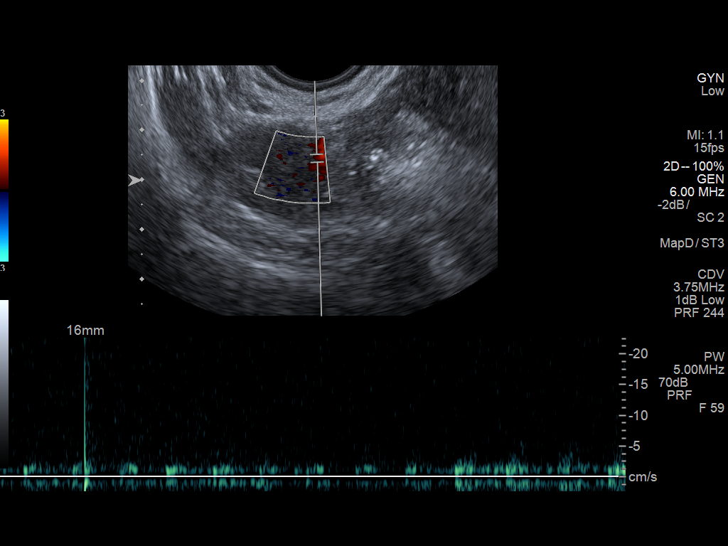
[im 53/56]
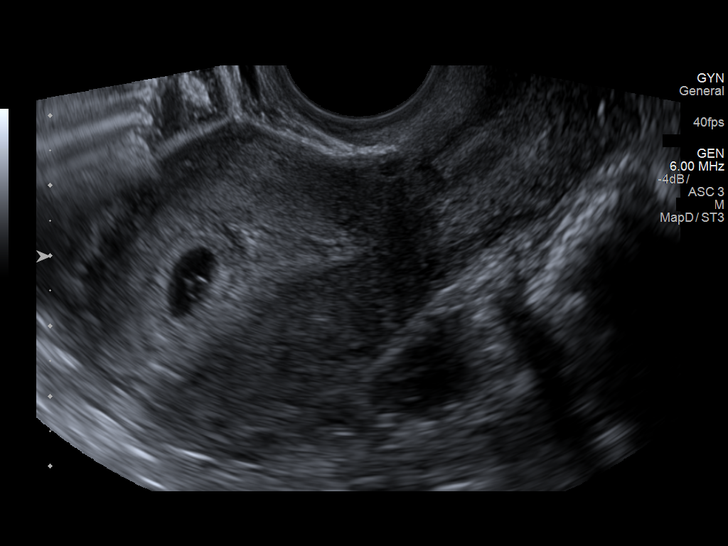

[13 of 28 positions shown; findings below may reference images not displayed]

FINDINGS: Intrauterine gestational sac: Single intrauterine gestational sac

Yolk sac:  Visualized

Embryo:  Not seen

Cardiac Activity: Not seen

MSD: 9.8  mm   5 w   5  d

Subchorionic hemorrhage:  None visualized.

Maternal uterus/adnexae: Bilateral ovaries are within normal limits.
Right ovary measures 2.8 x 2 x 2.5 cm. Left ovary measures 2.8 x
x 2.5 cm. No significant free fluid

Pulsed Doppler evaluation of both ovaries demonstrates normal
appearing low-resistance arterial and venous waveforms.
IMPRESSION: 1. Negative for ovarian torsion
2. Single intrauterine pregnancy with visualization of yolk sac but
no embryo. Consider follow-up ultrasound in 10-14 days to confirm
viability.

## 2019-06-26 ENCOUNTER — Telehealth: Payer: Self-pay | Admitting: Physician Assistant

## 2019-06-26 ENCOUNTER — Other Ambulatory Visit: Payer: Self-pay

## 2019-06-26 DIAGNOSIS — Z20822 Contact with and (suspected) exposure to covid-19: Secondary | ICD-10-CM

## 2019-06-26 MED ORDER — BENZONATATE 100 MG PO CAPS: 100.0000 mg | ORAL_CAPSULE | Freq: Three times a day (TID) | ORAL | 0 refills | Status: AC

## 2019-06-26 MED ORDER — ALBUTEROL SULFATE HFA 108 (90 BASE) MCG/ACT IN AERS: 2.0000 | INHALATION_SPRAY | Freq: Four times a day (QID) | RESPIRATORY_TRACT | 0 refills | Status: AC | PRN

## 2019-06-26 NOTE — Progress Notes (Signed)
E-Visit for Corona Virus Screening   Your current symptoms could be consistent with the coronavirus.  Many health care providers can now test patients at their office but not all are.  [place] has multiple testing sites. For information on our COVID testing locations and hours go to [email or web address]  Please quarantine yourself while awaiting your test results.  We are enrolling you in our [place] for [place] . Daily you will receive a questionnaire within the [place] website. Our COVID 19 response team willl be monitoriing your responses daily.    COVID-19 is a respiratory illness with symptoms that are similar to the flu. Symptoms are typically mild to moderate, but there have been cases of severe illness and death due to the virus. The following symptoms may appear 2-14 days after exposure: . Fever . Cough . Shortness of breath or difficulty breathing . Chills . Repeated shaking with chills . Muscle pain . Headache . Sore throat . New loss of taste or smell . Fatigue . Congestion or runny nose . Nausea or vomiting . Diarrhea  It is vitally important that if you feel that you have an infection such as this virus or any other virus that you stay home and away from places where you may spread it to others.  You should self-quarantine for 14 days if you have symptoms that could potentially be coronavirus or have been in close contact a with a person diagnosed with COVID-19 within the last 2 weeks. You should avoid contact with people age 44 and older.   You should wear a mask or cloth face covering over your nose and mouth if you must be around other people or animals, including pets (even at home). Try to stay at least 6 feet away from other people. This will protect the people around you.  You can use medication such as A prescription cough medication called Tessalon Perles 100 mg. You may take 1-2 capsules every 8 hours as needed for cough  and A prescription inhaler called Albuterol MDI 90 mcg /actuation 2 puffs every 4 hours as needed for shortness of breath, wheezing, cough  You may also take acetaminophen (Tylenol) as needed for fever.   Reduce your risk of any infection by using the same precautions used for avoiding the common cold or flu:  Marland Kitchen Wash your hands often with soap and warm water for at least 20 seconds.  If soap and water are not readily available, use an alcohol-based hand sanitizer with at least 60% alcohol.  . If coughing or sneezing, cover your mouth and nose by coughing or sneezing into the elbow areas of your shirt or coat, into a tissue or into your sleeve (not your hands). . Avoid shaking hands with others and consider head nods or verbal greetings only. . Avoid touching your eyes, nose, or mouth with unwashed hands.  . Avoid close contact with people who are sick. . Avoid places or events with large numbers of people in one location, like concerts or sporting events. . Carefully consider travel plans you have or are making. . If you are planning any travel outside or inside the [place], visit the CDC's Travelers' Health webpage for the latest health notices. . If you have some symptoms but not all symptoms, continue to monitor at home and seek medical attention if your symptoms worsen. . If you are having a medical emergency, call 911.  HOME CARE . Only take medications as instructed by your medical team. .  Drink plenty of fluids and get plenty of rest. . A steam or ultrasonic humidifier can help if you have congestion.   GET HELP RIGHT AWAY IF YOU HAVE EMERGENCY WARNING SIGNS** FOR COVID-19. If you or someone is showing any of these signs seek emergency medical care immediately. Call 911 or proceed to your closest emergency facility if: . You develop worsening high fever. . Trouble breathing . Bluish lips or face . Persistent pain or pressure in the chest . New confusion . Inability to wake or stay  awake . You cough up blood. . Your symptoms become more severe  **This list is not all possible symptoms. Contact your medical provider for any symptoms that are sever or concerning to you.   MAKE SURE YOU   Understand these instructions.  Will watch your condition.  Will get help right away if you are not doing well or get worse.  Your e-visit answers were reviewed by a board certified advanced clinical practitioner to complete your personal care plan.  Depending on the condition, your plan could have included both over the counter or prescription medications.  If there is a problem please reply once you have received a response from your provider.  Your safety is important to [place].  If you have drug allergies check your prescription carefully.    You can use MyChart to ask questions about today's visit, request a non-urgent call back, or ask for a work or school excuse for 24 hours related to this e-Visit. If it has been greater than 24 hours you will need to follow up with your provider, or enter a new e-Visit to address those concerns. You will get an e-mail in the next two days asking about your experience.  I hope that your e-visit has been valuable and will speed your recovery. Thank you for using e-visits.  Approximately 5 minutes was spent documenting and reviewing patient's chart.

## 2019-06-26 NOTE — Addendum Note (Signed)
Addended by: Rodney Booze on: 06/26/2019 10:06 AM   Modules accepted: Orders

## 2019-06-27 LAB — NOVEL CORONAVIRUS, NAA: SARS-CoV-2, NAA: NOT DETECTED

## 2020-03-18 ENCOUNTER — Encounter: Payer: 59 | Admitting: Family Medicine

## 2020-03-18 ENCOUNTER — Telehealth: Payer: Self-pay | Admitting: Family Medicine

## 2020-03-18 ENCOUNTER — Other Ambulatory Visit: Payer: Self-pay

## 2020-03-18 NOTE — Telephone Encounter (Signed)
I just got off the phone with my wife. She was quite upset because she was turned away for being 10 minutes late. I'm typically the late one, I get it. You have to keep a schedule. Totally understand. She should have called ahead to let someone know, etc.   She was late because she had to get both of our kids up, a 88 and 34 year old, get them cleaned up, fed, clothed. She had to get to [facility] to drop our oldest at his daycare/camp. She got there 15 minutes earlier in order to get in the front of the line to drop him off early enough to make it to her appointment with you, 34 yr old in tow. She ended up arriving 10 minutes late to your facility, with our daughter in a stroller, clearly trying her best to get there in time. You turned her away. No sympathy, no recognition that sometimes people might be late for reasons outside of their control. I asked her if she called ahead and she said she didn't. I told her that may have helped, but maybe it wouldn't have. Point is, it's a bit frustrating to try to do everything the right way and not have someone show some sort of compassion for in individual situation. You could have seen her, it's disappointing to hear you wouldn't.   Again, I understand policy. If you let one you gotta let em all. I get it. But I know some of you must be parents. Maybe their should be some sort of leeway for mothers or fathers that have the seemingly impossible task of taking care of themselves while trying to raise healthy children. It wouldn't have put you behind to see her. Not every situation needs exception, if any one does it's this one. So I hope you take it into consideration, for other moms and dads trying their best to scramble and get their own stuff taken care of, when it seems an impossible task a little compassion can go a long way.  We've been a part of [employer] for years. I've referred patients to you all. I've been happy with everything about the care we've  received. However, in the instance, you've fallen short. Enjoy the rest of your week, we'll try better next time. I hope you will too.    *Patient arrived 16 mins late

## 2020-06-22 ENCOUNTER — Encounter: Payer: 59 | Admitting: Family Medicine

## 2020-10-02 NOTE — L&D Delivery Note (Signed)
Delivery Note:   Jessica Schwartz [id number] at [redacted]w[redacted]d  Admitting diagnosis: Normal labor [O80, Z37.9] Risks: Hx C/S, VBAC x 1  GBS positive  First Stage:  Induction of labor:N/A  Onset of labor: 02/16/2021 @ 2330 Augmentation: N/A ROM: SROM @ 2330 clear fluid  Active labor onset: 0015  Analgesia /Anesthesia/Pain control intrapartum: None   Second Stage:  Complete dilation at   0235 Onset of pushing at 0230 FHR second stage: Intermittent Auscultation FHR 120, increases noted, no decreases.    Pt pushing in hands and knees position with SNM, CNM and L&D staff support at bedside encouraging. Fetal head crowned up to +4 station. CNM encouraged a lunge position to increase pelvic diameter. FHR 130's with no decreases per doppler. Pt began pushing in Right Later lunge position. Doula "Sarah" and FOB [name]  present for birth and supportive.  Nuchal Cord: No  Delivery of a Live born female  Birth Weight:  Pending APGAR: 8, 9  Newborn Delivery   Birth date/time: 02/17/2021 02:56:00 Delivery type: VBAC, Spontaneous    Infant delivered in cephalic presentation, in DOA position and restituted to ROA  Position.With next contraction, infant shoulders and remaining fetal body delivered with ease. Mom encouraged to reach down a grab baby. Infant dried and stimulated and vigorously crying by 1 minute of life.   Cord double clamped after cessation of pulsation by SNM, cut by FOB [name].  Collection of cord blood for typing completed. Cord blood donation-None  Arterial cord blood sample-No    Third Stage:  Placenta delivered with gentle cord traction and maternal pushing efforts, intact  with 3 vessels . Uterine tone firm bleeding minimal without clots  Uterotonics: IM Pitocin Placenta to L&D for disposal.  None  laceration identified.Episiotomy:None  Local analgesia: 1% Lidocaine injected into perineum.   Repair: Inclusion cyst developed over the course of the week in lower right vaginal  wall. Cyst slightly ruptured during delivery, but majority remained intact. Perineum numbed with 1% Lidocaine, and cyst drained with aseptic technique. Cyst wall removed. Small amount of bleeding noted from encapsulation. 4.0 Vicryl used to approximate tissues for good hemostasis.  Est. Blood Loss (NO):67.67   Complications: Placental Abruption   Mom to postpartum.  Baby Boy Eulas Post to [employer]. Delivery Report:  Review the Delivery Report for details.     Signed: Juliann Mule), BSN, RNC-OB, Student Nurse Midwife 02/17/2021, 3:26 AM

## 2020-10-14 LAB — OB RESULTS CONSOLE HEPATITIS B SURFACE ANTIGEN: Hepatitis B Surface Ag: NEGATIVE

## 2020-10-14 LAB — OB RESULTS CONSOLE RUBELLA ANTIBODY, IGM: Rubella: NON-IMMUNE/NOT IMMUNE

## 2020-10-14 LAB — OB RESULTS CONSOLE HIV ANTIBODY (ROUTINE TESTING): HIV: NONREACTIVE

## 2020-10-14 LAB — OB RESULTS CONSOLE GC/CHLAMYDIA
Chlamydia: NEGATIVE
Gonorrhea: NEGATIVE

## 2021-01-18 LAB — OB RESULTS CONSOLE RPR: RPR: NONREACTIVE

## 2021-01-27 LAB — OB RESULTS CONSOLE GBS: GBS: POSITIVE

## 2021-02-17 ENCOUNTER — Inpatient Hospital Stay (HOSPITAL_COMMUNITY)
Admission: AD | Admit: 2021-02-17 | Discharge: 2021-02-19 | DRG: 768 | Disposition: A | Discharge status: Home / Self Care | Payer: 59 | Attending: Obstetrics and Gynecology | Admitting: Obstetrics and Gynecology

## 2021-02-17 ENCOUNTER — Encounter (HOSPITAL_COMMUNITY): Payer: Self-pay

## 2021-02-17 ENCOUNTER — Other Ambulatory Visit: Payer: Self-pay

## 2021-02-17 DIAGNOSIS — N9089 Other specified noninflammatory disorders of vulva and perineum: Secondary | ICD-10-CM | POA: Diagnosis present

## 2021-02-17 DIAGNOSIS — O26893 Other specified pregnancy related conditions, third trimester: Secondary | ICD-10-CM | POA: Diagnosis present

## 2021-02-17 DIAGNOSIS — O99824 Streptococcus B carrier state complicating childbirth: Secondary | ICD-10-CM | POA: Diagnosis present

## 2021-02-17 DIAGNOSIS — Z3A39 39 weeks gestation of pregnancy: Secondary | ICD-10-CM | POA: Diagnosis not present

## 2021-02-17 DIAGNOSIS — Z20822 Contact with and (suspected) exposure to covid-19: Secondary | ICD-10-CM | POA: Diagnosis present

## 2021-02-17 DIAGNOSIS — O4593 Premature separation of placenta, unspecified, third trimester: Principal | ICD-10-CM | POA: Diagnosis present

## 2021-02-17 DIAGNOSIS — O99892 Other specified diseases and conditions complicating childbirth: Secondary | ICD-10-CM | POA: Diagnosis present

## 2021-02-17 DIAGNOSIS — L720 Epidermal cyst: Secondary | ICD-10-CM | POA: Diagnosis present

## 2021-02-17 DIAGNOSIS — O34219 Maternal care for unspecified type scar from previous cesarean delivery: Secondary | ICD-10-CM | POA: Diagnosis not present

## 2021-02-17 LAB — CBC
HCT: 36 % (ref 36.0–46.0)
HCT: 39.2 % (ref 36.0–46.0)
Hemoglobin: 12.1 g/dL (ref 12.0–15.0)
Hemoglobin: 13.1 g/dL (ref 12.0–15.0)
MCH: 30.4 pg (ref 26.0–34.0)
MCH: 30.5 pg (ref 26.0–34.0)
MCHC: 33.4 g/dL (ref 30.0–36.0)
MCHC: 33.6 g/dL (ref 30.0–36.0)
MCV: 90.7 fL (ref 80.0–100.0)
MCV: 91 fL (ref 80.0–100.0)
Platelets: 296 10*3/uL (ref 150–400)
Platelets: 345 10*3/uL (ref 150–400)
RBC: 3.97 MIL/uL (ref 3.87–5.11)
RBC: 4.31 MIL/uL (ref 3.87–5.11)
RDW: 13.7 % (ref 11.5–15.5)
RDW: 13.7 % (ref 11.5–15.5)
WBC: 14.5 10*3/uL — ABNORMAL HIGH (ref 4.0–10.5)
WBC: 25.7 10*3/uL — ABNORMAL HIGH (ref 4.0–10.5)
nRBC: 0 % (ref 0.0–0.2)
nRBC: 0 % (ref 0.0–0.2)

## 2021-02-17 LAB — SARS CORONAVIRUS 2 (TAT 6-24 HRS): SARS Coronavirus 2: NEGATIVE

## 2021-02-17 LAB — TYPE AND SCREEN
ABO/RH(D): O POS
Antibody Screen: NEGATIVE

## 2021-02-17 LAB — RPR: RPR Ser Ql: NONREACTIVE

## 2021-02-17 MED ORDER — SODIUM CHLORIDE 0.9% FLUSH: 3.0000 mL | INTRAVENOUS | Status: DC | PRN

## 2021-02-17 MED ORDER — OXYTOCIN BOLUS FROM INFUSION: 333.0000 mL | Freq: Once | INTRAVENOUS | Status: DC

## 2021-02-17 MED ORDER — PENICILLIN G POT IN DEXTROSE 60000 UNIT/ML IV SOLN: 3.0000 10*6.[IU] | INTRAVENOUS | Status: DC

## 2021-02-17 MED ORDER — IBUPROFEN 800 MG PO TABS
800.0000 mg | ORAL_TABLET | Freq: Once | ORAL | Status: AC
  Administered 2021-02-17: 800 mg via ORAL
  Filled 2021-02-17: qty 1

## 2021-02-17 MED ORDER — ONDANSETRON HCL 4 MG PO TABS: 4.0000 mg | ORAL_TABLET | ORAL | Status: DC | PRN

## 2021-02-17 MED ORDER — SODIUM CHLORIDE 0.9 % IV SOLN
12.5000 mg | Freq: Once | INTRAVENOUS | Status: DC
  Filled 2021-02-17: qty 0.5

## 2021-02-17 MED ORDER — SENNOSIDES-DOCUSATE SODIUM 8.6-50 MG PO TABS
2.0000 | ORAL_TABLET | Freq: Every day | ORAL | Status: DC
  Administered 2021-02-18: 2 via ORAL
  Filled 2021-02-17: qty 2

## 2021-02-17 MED ORDER — LIDOCAINE HCL (PF) 1 % IJ SOLN
30.0000 mL | INTRAMUSCULAR | Status: AC | PRN
  Administered 2021-02-17: 30 mL via SUBCUTANEOUS
  Filled 2021-02-17: qty 30

## 2021-02-17 MED ORDER — ZOLPIDEM TARTRATE 5 MG PO TABS: 5.0000 mg | ORAL_TABLET | Freq: Every evening | ORAL | Status: DC | PRN

## 2021-02-17 MED ORDER — SIMETHICONE 80 MG PO CHEW: 80.0000 mg | CHEWABLE_TABLET | ORAL | Status: DC | PRN

## 2021-02-17 MED ORDER — IBUPROFEN 600 MG PO TABS
600.0000 mg | ORAL_TABLET | Freq: Four times a day (QID) | ORAL | Status: DC
  Administered 2021-02-17 – 2021-02-19 (×8): 600 mg via ORAL
  Filled 2021-02-17 (×8): qty 1

## 2021-02-17 MED ORDER — OXYTOCIN-SODIUM CHLORIDE 30-0.9 UT/500ML-% IV SOLN: 2.5000 [IU]/h | INTRAVENOUS | Status: DC

## 2021-02-17 MED ORDER — DIPHENHYDRAMINE HCL 25 MG PO CAPS: 25.0000 mg | ORAL_CAPSULE | Freq: Four times a day (QID) | ORAL | Status: DC | PRN

## 2021-02-17 MED ORDER — COCONUT OIL OIL: 1.0000 "application " | TOPICAL_OIL | Status: DC | PRN

## 2021-02-17 MED ORDER — ALBUTEROL SULFATE (2.5 MG/3ML) 0.083% IN NEBU: 3.0000 mL | INHALATION_SOLUTION | Freq: Four times a day (QID) | RESPIRATORY_TRACT | Status: DC | PRN

## 2021-02-17 MED ORDER — MEASLES, MUMPS & RUBELLA VAC IJ SOLR: 0.5000 mL | Freq: Once | INTRAMUSCULAR | Status: DC

## 2021-02-17 MED ORDER — SODIUM CHLORIDE 0.9 % IV SOLN: 250.0000 mL | INTRAVENOUS | Status: DC | PRN

## 2021-02-17 MED ORDER — LACTATED RINGERS IV SOLN: 500.0000 mL | INTRAVENOUS | Status: DC | PRN

## 2021-02-17 MED ORDER — BENZOCAINE-MENTHOL 20-0.5 % EX AERO: 1.0000 "application " | INHALATION_SPRAY | CUTANEOUS | Status: DC | PRN

## 2021-02-17 MED ORDER — SOD CITRATE-CITRIC ACID 500-334 MG/5ML PO SOLN: 30.0000 mL | ORAL | Status: DC | PRN

## 2021-02-17 MED ORDER — ONDANSETRON HCL 4 MG/2ML IJ SOLN: 4.0000 mg | INTRAMUSCULAR | Status: DC | PRN

## 2021-02-17 MED ORDER — LACTATED RINGERS IV SOLN: INTRAVENOUS | Status: DC

## 2021-02-17 MED ORDER — SODIUM CHLORIDE 0.9% FLUSH: 3.0000 mL | Freq: Two times a day (BID) | INTRAVENOUS | Status: DC

## 2021-02-17 MED ORDER — ACETAMINOPHEN 325 MG PO TABS: 650.0000 mg | ORAL_TABLET | ORAL | Status: DC | PRN

## 2021-02-17 MED ORDER — ACETAMINOPHEN 325 MG PO TABS
650.0000 mg | ORAL_TABLET | ORAL | Status: DC | PRN
  Administered 2021-02-17: 650 mg via ORAL
  Filled 2021-02-17: qty 2

## 2021-02-17 MED ORDER — PRENATAL MULTIVITAMIN CH
1.0000 | ORAL_TABLET | Freq: Every day | ORAL | Status: DC
  Administered 2021-02-17 – 2021-02-18 (×2): 1 via ORAL
  Filled 2021-02-17 (×2): qty 1

## 2021-02-17 MED ORDER — TETANUS-DIPHTH-ACELL PERTUSSIS 5-2.5-18.5 LF-MCG/0.5 IM SUSY: 0.5000 mL | PREFILLED_SYRINGE | Freq: Once | INTRAMUSCULAR | Status: DC

## 2021-02-17 MED ORDER — SODIUM CHLORIDE 0.9 % IV SOLN
5.0000 10*6.[IU] | Freq: Once | INTRAVENOUS | Status: AC
  Administered 2021-02-17: 5 10*6.[IU] via INTRAVENOUS
  Filled 2021-02-17: qty 5

## 2021-02-17 MED ORDER — FENTANYL CITRATE (PF) 100 MCG/2ML IJ SOLN: 50.0000 ug | INTRAMUSCULAR | Status: DC | PRN

## 2021-02-17 MED ORDER — WITCH HAZEL-GLYCERIN EX PADS: 1.0000 "application " | MEDICATED_PAD | CUTANEOUS | Status: DC | PRN

## 2021-02-17 MED ORDER — ONDANSETRON HCL 4 MG/2ML IJ SOLN
4.0000 mg | Freq: Four times a day (QID) | INTRAMUSCULAR | Status: DC | PRN
  Administered 2021-02-17: 4 mg via INTRAVENOUS
  Filled 2021-02-17: qty 2

## 2021-02-17 MED ORDER — OXYTOCIN 10 UNIT/ML IJ SOLN
10.0000 [IU] | Freq: Once | INTRAMUSCULAR | Status: AC
  Administered 2021-02-17: 10 [IU] via INTRAMUSCULAR
  Filled 2021-02-17: qty 1

## 2021-02-17 MED ORDER — DIBUCAINE (PERIANAL) 1 % EX OINT: 1.0000 "application " | TOPICAL_OINTMENT | CUTANEOUS | Status: DC | PRN

## 2021-02-17 NOTE — Progress Notes (Signed)
INTERVAL NOTE: S/P VBAC  Live born female  Birth Weight: 7 lb 9.5 oz (3445 g) APGAR: 8, 9  Newborn Delivery   Birth date/time: 02/17/2021 02:56:00 Delivery type: VBAC, Spontaneous     Baby name: Eulas Post Delivering provider: Derrell Lolling C  Episiotomy:None   Lacerations:None   S:  Sitting in bed, BFing, min cramping, (+) voids, small bleed, denies HA/NV/dizziness  O:   Vitals:   02/17/21 0415 02/17/21 0510 02/17/21 0600 02/17/21 0751  BP: 118/69 (!) 118/58 110/60 (!) 100/53  Pulse: 85 89 69 89  Resp: 18 17 17 16   Temp:  98 F (36.7 C) 98 F (36.7 C) 98.4 F (36.9 C)  TempSrc:  Axillary Oral Oral  SpO2:  99% 98% 99%     AAO x 3, NAD  FF bellow U  Scant lochia  A / P:    Principal Problem:   Postpartum care following vaginal delivery 5/19 Active Problems:   Vaginal birth after cesarean (VBAC) 5/19   Normal labor   Inclusion cyst  - removed at delivery  PPD #0  Stable post partum  Routine PP orders  Plan baby circ later today  Gicela Schwarting, CNM, MSN  02/17/2021 10:53 AM

## 2021-02-17 NOTE — Progress Notes (Addendum)
FHT- 146 BP- 134/75, 90 pulse, 100 Sp02 Covid swabbed IV placed, admit labs drawn   CE: 5/90 Danielle CNM SROM

## 2021-02-17 NOTE — Social Work (Signed)
CSW received consult for hx of Anxiety. CSW met with MOB to offer support and complete assessment.    CSW met with MOB at bedside. CSW congratulated MOB, FOB and introduced role. CSW observed MOB up and moving in room and FOB was holding and bonding with the infant. CSW confirm MOB demographic information was correct. CSW explain the reason for the visit. MOB presented pleasant, calm and receptive to CSW visit.   CSW inquired about MOB history of anxiety. MOB reports she does have a history of anxiety. MOB reports normal feelings of anxiousness but nothing that interferes with her activities of daily living. CSW inquired about MOB coping skills. MOB reports she will take a walk outside to give her a break from the children.CSW praised MOB coping skills.   CSW provided education regarding the baby blues period vs. perinatal mood disorders, discussed treatment and gave resources for mental health follow up if concerns arise. CSW recommended MOB complete a self-evaluation during the postpartum time period using the New Mom Checklist from Postpartum Progress and encouraged MOB to contact a medical professional if symptoms are noted. MOB was receptive to the resources provided. CSW assessed MOB for safety, MOB denies thoughts of harm to self and others. CSW inquired about MOB supports. MOB acknowledges her spouse, grandparents, neighbors and friends as supports.   CSW provided review of Sudden Infant Death Syndrome (SIDS) precautions and informed MOB no-co sleeping with the infant. MOB reports the infant will sleep in a bassinet. MOB reports she has all items for the infant including a car seat. MOB has chosen [facility]. CSW assessed MOB for additional needs. MOB reports no further need.   CSW identifies no further need for intervention and no barriers to discharge at this time.  Anelise Staron, MSW, LCSW [facility]  Clinical Social Worker  [phone] 02/17/2021  2:32 PM 

## 2021-02-17 NOTE — H&P (Addendum)
OB ADMISSION/ HISTORY & PHYSICAL:  Admission Date: 02/17/2021 12:00 AM  Admit Diagnosis: CTX 47min    Jessica Schwartz is a 35 y.o. female presenting for SROM at 2345, seen in office today for prodromal labor past couple days, membrane sweep done. Desires natural labor/TOLAC, spouse [name] present and supportive, doula [name] en route.  Prenatal History: [id number]   EDC : 02/22/2021 Prenatal care at [facility] since 1st trim   Prenatal course complicated by: 1. Hx C/S, VBAC x 1 2. GBS positive 3. Perineal inclusion cyst  Prenatal Labs: ABO, Rh:   O pos Antibody:  neg Rubella:   non-immune RPR:   NR HBsAg:   neg HIV:   neg GBS:   pos 1 hr Glucola : 110 Genetic Screening: NIPS LR XY, NT sono wnl, AFP 1 negative Ultrasound: normal anatomy, posterior placenta, AGA Growth 35 wks 27%, AFI WNL  Vaccines: TDaP          UTD         Flu             UTD                    COVID-19 UTD    Maternal Diabetes: No Genetic Screening: Normal Maternal Ultrasounds/Referrals: Normal Fetal Ultrasounds or other Referrals:  None Maternal Substance Abuse:  No Significant Maternal Medications:  None Significant Maternal Lab Results:  Group B Strep positive Other Comments:  None  Medical / Surgical History :  Past medical history: No past medical history on file.   Past surgical history:  Past Surgical History:  Procedure Laterality Date  . BREAST SURGERY     biopsy-fibrocystic only  . CESAREAN SECTION N/A 03/06/2016   Procedure: CESAREAN SECTION;  Surgeon: Cheri Fowler, MD;  Location: [place];  Service: Obstetrics;  Laterality: N/A;  . WISDOM TOOTH EXTRACTION Bilateral 2008     Family History:  Family History  Problem Relation Age of Onset  . Breast cancer Maternal Aunt 60  . Diabetes Maternal Grandmother   . Breast cancer Maternal Grandmother 60  . Heart attack Paternal Grandfather 66  . Hyperlipidemia Paternal Grandfather      Social History:   reports that she has never smoked. She has never used smokeless tobacco. She reports that she does not drink alcohol and does not use drugs.   Allergies: Patient has no known allergies.   Current Medications at time of admission:  Medications Prior to Admission  Medication Sig Dispense Refill Last Dose  . albuterol (VENTOLIN HFA) 108 (90 Base) MCG/ACT inhaler Inhale 2 puffs into the lungs every 6 (six) hours as needed for wheezing or shortness of breath. 8 g 0   . Omega-3 Fatty Acids (FISH OIL) 500 MG CAPS Take 1 capsule by mouth daily.        Review of Systems: ROS Painful ctx, back pain. LOF clear, no HA/NV/RUQ pain Physical Exam: Vital signs and nursing notes reviewed.  No data found.   General: AAO x 3, NAD, coping well Heart: RRR Lungs:CTAB Abdomen: Gravid, NT, Leopold's vertex Extremities: no edema Genitalia / VE:   5/90/-1, clear fluid, vertex Perineal inclusion cyst ~ 3 cm diameter  FHR: 130 BPM, mod variability, + accels, no decels TOCO: Ctx q2-3 min  Labs:   Pending T&S, CBC, RPR  No results for input(s): WBC, HGB, HCT, PLT in the last 72 hours.   Assessment:  35 y.o. G3P2002 at [redacted]w[redacted]d Hx C/S, VBAC x  1 SROM Inclusion cyst of perineum  1. Active stage of labor 2. FHR category 1 3. GBS pos 4. Desires unmedicated birth 5. Breastfeeding 6. Placenta disposal per patient request  Plan:  1. Admit to BS 2. Routine L&D orders, PCN prophylaxis 3. Analgesia/anesthesia PRN  4. Expectant management 5. Removal of perineal cyst after delivery 6. Anticipate VBAC 2   Dr Murrell Redden notified of admission / plan of care   [place], MSN 02/17/2021, 12:11 AM

## 2021-02-18 LAB — BIRTH TISSUE RECOVERY COLLECTION (PLACENTA DONATION)

## 2021-02-18 NOTE — Progress Notes (Signed)
PPD # 1 S/P NSVD  Live born female  Birth Weight: 7 lb 9.5 oz (3445 g) APGAR: 8, 9  Newborn Delivery   Birth date/time: 02/17/2021 02:56:00 Delivery type: VBAC, Spontaneous     Baby name: Jessica Schwartz Delivering provider: Juliene Pina  Episiotomy:None   Lacerations:None   Circumcision Yes, planning outpatient  Feeding: breast  Pain control at delivery: Local   S:  Reports feeling well with no complaints. Desires discharge tomorrow.              Tolerating PO/No nausea or vomiting             Bleeding is light             Pain controlled with acetaminophen and ibuprofen (OTC)             Up ad lib/ambulatory/voiding without difficulties   O:  A & O x 3, in no apparent distress              VS:  Vitals:   02/17/21 1310 02/17/21 1710 02/17/21 2212 02/18/21 0628  BP: (!) 105/57 (!) 102/54 111/66 103/68  Pulse: 63 63 61 (!) 57  Resp: 16 16 16    Temp: 97.8 F (36.6 C)  98.2 F (36.8 C) 97.9 F (36.6 C)  TempSrc: Oral  Oral Oral  SpO2: 98% 98% 97%     LABS:  Recent Labs    02/17/21 0005 02/17/21 0524  WBC 14.5* 25.7*  HGB 13.1 12.1  HCT 39.2 36.0  PLT 345 296    Blood type: --/--/O POS (05/19 0005)  Rubella: Nonimmune (01/13 0000)   I&O: I/O last 3 completed shifts: In: 240 [P.O.:240] Out: 50 [Blood:50]          No intake/output data recorded.  Vaccines: TDaP          UTD                    COVID-19 UTD   Gen: AAO x 3, NAD  Abdomen: soft, non-tender, non-distended             Fundus: firm, non-tender, U-2  Perineum: intact  Lochia: small  Extremities: no edema, no calf pain or tenderness   A/P:  PPD # 2 35 y.o., G3T5176  Principal Problem:   Postpartum care following vaginal delivery 5/19  Doing well - stable status  Routine Schwartz partum orders Active Problems:   Vaginal birth after cesarean (VBAC) 5/19   Normal labor   Inclusion cyst  Anticipate discharge tomorrow.   Suzan Nailer, MSN, CNM 02/18/2021, 12:12 PM

## 2021-02-19 MED ORDER — IBUPROFEN 600 MG PO TABS: 600.0000 mg | ORAL_TABLET | Freq: Four times a day (QID) | ORAL | 0 refills | Status: DC

## 2021-02-19 MED ORDER — ACETAMINOPHEN 325 MG PO TABS: 650.0000 mg | ORAL_TABLET | ORAL | 1 refills | Status: DC | PRN

## 2021-02-19 NOTE — Discharge Summary (Signed)
OB Discharge Summary  Patient Name: Jessica Schwartz DOB: 05-18-1986 MRN: [id number]  Date of admission: 02/17/2021 Delivering provider: Juliene Pina   Admitting diagnosis: Normal labor [O80, Z37.9] Intrauterine pregnancy: [redacted]w[redacted]d     Secondary diagnosis: Patient Active Problem List   Diagnosis Date Noted  . Normal labor 02/17/2021  . Inclusion cyst 02/17/2021  . Vaginal birth after cesarean (VBAC) 5/19 02/23/2018  . Postpartum care following vaginal delivery 5/19 02/23/2018    Date of discharge: 02/19/2021   Discharge diagnosis: Principal Problem:   Postpartum care following vaginal delivery 5/19 Active Problems:   Vaginal birth after cesarean (VBAC) 5/19   Normal labor   Inclusion cyst                                                         Post partum procedures:None  Augmentation: N/A Pain control: Local  Laceration:None  Episiotomy:None  Complications: None  Hospital course:  Onset of Labor With Vaginal Delivery   35 y.o. yo G3P3003 at [redacted]w[redacted]d was admitted to the hospital 02/17/2021 for spontaneous labor. Patient had an uncomplicated labor course as follows: Membrane Rupture Time/Date: 11:30 PM ,02/16/2021   Delivery Method:VBAC, Spontaneous  Episiotomy: None  Lacerations:  None  Details of delivery can be found in separate delivery note.  Patient had a routine postpartum course. Patient is discharged home 02/19/21.  Newborn Data: Birth date:02/17/2021  Birth time:2:56 AM  Gender:Female  Living status:Living  Apgars:8 ,9  [id number] g   Physical exam  Vitals:   02/18/21 0628 02/18/21 1302 02/18/21 2126 02/19/21 0549  BP: 103/68 101/65 109/60 106/62  Pulse: (!) 57 78 64 67  Resp:  17 16 16   Temp: 97.9 F (36.6 C) 98 F (36.7 C) 98 F (36.7 C) 97.9 F (36.6 C)  TempSrc: Oral Oral Oral Oral  SpO2:  98% 99%    General: alert, cooperative and no distress Lochia: appropriate Uterine Fundus: firm Perineum: intact DVT Evaluation: No evidence of DVT seen on  physical exam. No significant calf/ankle edema. Labs: Lab Results  Component Value Date   WBC 25.7 (H) 02/17/2021   HGB 12.1 02/17/2021   HCT 36.0 02/17/2021   MCV 90.7 02/17/2021   PLT 296 02/17/2021   CMP Latest Ref Rng & Units 03/18/2019  Glucose 70 - 99 mg/dL 75  BUN 6 - 23 mg/dL 13  Creatinine 0.40 - 1.20 mg/dL 0.60  Sodium 135 - 145 mEq/L 138  Potassium 3.5 - 5.1 mEq/L 4.5  Chloride 96 - 112 mEq/L 106  CO2 19 - 32 mEq/L 27  Calcium 8.4 - 10.5 mg/dL 8.7  Total Protein 6.0 - 8.3 g/dL 6.2  Total Bilirubin 0.2 - 1.2 mg/dL 1.2  Alkaline Phos 39 - 117 U/L 56  AST 0 - 37 U/L 21  ALT 0 - 35 U/L 22   Edinburgh Postnatal Depression Scale Screening Tool 02/17/2021 02/17/2021 02/17/2021 02/23/2018  I have been able to laugh and see the funny side of things. 0 (No Data) (No Data) 0  I have looked forward with enjoyment to things. 0 - - 0  I have blamed myself unnecessarily when things went wrong. 1 - - 1  I have been anxious or worried for no good reason. 0 - - 1  I have felt scared or panicky for no good reason.  0 - - 0  Things have been getting on top of me. 0 - - 0  I have been so unhappy that I have had difficulty sleeping. 0 - - 0  I have felt sad or miserable. 0 - - 0  I have been so unhappy that I have been crying. 0 - - 0  The thought of harming myself has occurred to me. 0 - - 0  Edinburgh Postnatal Depression Scale Total 1 - - 2    Vaccines: TDaP UTD         Flu    UTD         COVID-19   UTD  Discharge instructions:  per After Visit Summary  After Visit Meds:  Allergies as of 02/19/2021   No Known Allergies     Medication List    TAKE these medications   acetaminophen 325 MG tablet Commonly known as: Tylenol Take 2 tablets (650 mg total) by mouth every 4 (four) hours as needed (for pain scale < 4).   albuterol 108 (90 Base) MCG/ACT inhaler Commonly known as: VENTOLIN HFA Inhale 2 puffs into the lungs every 6 (six) hours as needed for wheezing or shortness of  breath.   Fish Oil 500 MG Caps Take 1 capsule by mouth daily.   ibuprofen 600 MG tablet Commonly known as: ADVIL Take 1 tablet (600 mg total) by mouth every 6 (six) hours.   prenatal multivitamin Tabs tablet Take 1 tablet by mouth daily at 12 noon.      Diet: routine diet  Activity: Advance as tolerated. Pelvic rest for 6 weeks.   Newborn Data: Live born female  Birth Weight: 7 lb 9.5 oz (3445 g) APGAR: 8, 9  Newborn Delivery   Birth date/time: 02/17/2021 02:56:00 Delivery type: VBAC, Spontaneous     Named Eulas Post Baby Feeding: Breast Disposition:home with mother  Delivery Report:   Review the Delivery Report for details.    Follow up:  Follow-up Information    Juliene Pina, CNM. Schedule an appointment as soon as possible for a visit in 6 week(s).   Specialty: Obstetrics and Gynecology Contact information: [place] [address] [place], CNM, MSN 02/19/2021, 8:55 AM

## 2021-02-22 ENCOUNTER — Inpatient Hospital Stay (HOSPITAL_COMMUNITY): Admit: 2021-02-22 | Payer: Self-pay

## 2022-05-31 LAB — OB RESULTS CONSOLE HIV ANTIBODY (ROUTINE TESTING): HIV: NONREACTIVE

## 2022-05-31 LAB — OB RESULTS CONSOLE RPR: RPR: NONREACTIVE

## 2022-05-31 LAB — OB RESULTS CONSOLE RUBELLA ANTIBODY, IGM: Rubella: IMMUNE

## 2022-05-31 LAB — HEPATITIS C ANTIBODY: HCV Ab: NEGATIVE

## 2022-06-01 LAB — OB RESULTS CONSOLE HEPATITIS B SURFACE ANTIGEN: Hepatitis B Surface Ag: NEGATIVE

## 2022-06-01 LAB — OB RESULTS CONSOLE GC/CHLAMYDIA
Chlamydia: NEGATIVE
Neisseria Gonorrhea: NEGATIVE

## 2022-10-02 NOTE — L&D Delivery Note (Signed)
      Delivery Note:   [id number] at [redacted]w[redacted]d  Admitting diagnosis: Normal labor [O80, Z37.9] Risks: Hx C/S for arrest of descent/failed vacuum then VBAC x 2 unmedicated Hx precipitous labor with G3 GBS positive    First Stage:  Induction of labor:NA Onset of labor: 2325 12/17/2022 Augmentation: AROM and Pitocin ROM: 1910 12/18/2022 clear fluid Active labor onset: 1700 12/18/2022 Hydrotherapy: shower Analgesia /Anesthesia/Pain control intrapartum: Epidural   Second Stage:  Complete dilation at 12/18/2022  2040 Onset of pushing at 2045 FHR second stage Cat 2, variables   Pushing in R lateral position with CNM and L&D staff support, [name] present for birth and supportive. Nuchal Cord: No  Delivery of a Live born female  Birth Weight:  pending APGAR: 8, 9  Newborn Delivery   Birth date/time: 12/18/2022 20:55:00 Delivery type: Vaginal, Spontaneous      in cephalic presentation, position OA to LOT. Shoulders and body delivered in controlled fashion with gentle traction. Newborn with spontaneous cry, vigorous tone, dried and stimulated, placed on maternal abdomen. Terminal meconium noted.  Cord double clamped after placenta delivery, cut by [name].  Collection of cord blood for typing completed. Cord blood donation-None  Arterial cord blood sample-No    Third Stage:  Placenta delivered 12/18/2022 at 2105-Spontaneous  with 3 vessels . Uterine tone firm, bleeding small. Uterotonics: Pitocin IV bolus Placenta to L&D for disposal. No laceration identified.  Episiotomy:None  Local analgesia: na  Repair:NA Est. Blood Loss ([id number]   Complications: None   Mom to postpartum.  Baby Katelyn to Couplet care / Skin to Skin.  Delivery Report:  Review the Delivery Report for details.     Signed: Juliene Schwartz, CNM, MSN 12/18/2022, 9:20 PM

## 2022-10-06 LAB — OB RESULTS CONSOLE RPR: RPR: NONREACTIVE

## 2022-11-30 LAB — OB RESULTS CONSOLE GBS: GBS: POSITIVE

## 2022-12-18 ENCOUNTER — Inpatient Hospital Stay (HOSPITAL_COMMUNITY): Payer: 59 | Admitting: Anesthesiology

## 2022-12-18 ENCOUNTER — Inpatient Hospital Stay (HOSPITAL_COMMUNITY)
Admission: AD | Admit: 2022-12-18 | Discharge: 2022-12-20 | DRG: 807 | Disposition: A | Discharge status: Home / Self Care | Payer: 59 | Attending: Obstetrics & Gynecology | Admitting: Obstetrics & Gynecology

## 2022-12-18 ENCOUNTER — Other Ambulatory Visit: Payer: Self-pay

## 2022-12-18 ENCOUNTER — Encounter (HOSPITAL_COMMUNITY): Payer: Self-pay

## 2022-12-18 DIAGNOSIS — O34219 Maternal care for unspecified type scar from previous cesarean delivery: Secondary | ICD-10-CM | POA: Diagnosis present

## 2022-12-18 DIAGNOSIS — Z3A39 39 weeks gestation of pregnancy: Secondary | ICD-10-CM | POA: Diagnosis not present

## 2022-12-18 DIAGNOSIS — O99824 Streptococcus B carrier state complicating childbirth: Secondary | ICD-10-CM | POA: Diagnosis present

## 2022-12-18 DIAGNOSIS — O26893 Other specified pregnancy related conditions, third trimester: Secondary | ICD-10-CM | POA: Diagnosis present

## 2022-12-18 LAB — CBC
HCT: 35.9 % — ABNORMAL LOW (ref 36.0–46.0)
Hemoglobin: 12.3 g/dL (ref 12.0–15.0)
MCH: 30.1 pg (ref 26.0–34.0)
MCHC: 34.3 g/dL (ref 30.0–36.0)
MCV: 88 fL (ref 80.0–100.0)
Platelets: 353 10*3/uL (ref 150–400)
RBC: 4.08 MIL/uL (ref 3.87–5.11)
RDW: 13.9 % (ref 11.5–15.5)
WBC: 11.8 10*3/uL — ABNORMAL HIGH (ref 4.0–10.5)
nRBC: 0 % (ref 0.0–0.2)

## 2022-12-18 LAB — TYPE AND SCREEN
ABO/RH(D): O POS
Antibody Screen: NEGATIVE

## 2022-12-18 LAB — RPR: RPR Ser Ql: NONREACTIVE

## 2022-12-18 MED ORDER — SODIUM CHLORIDE 0.9 % IV SOLN
5.0000 10*6.[IU] | Freq: Once | INTRAVENOUS | Status: AC
  Administered 2022-12-18: 5 10*6.[IU] via INTRAVENOUS

## 2022-12-18 MED ORDER — TERBUTALINE SULFATE 1 MG/ML IJ SOLN: 0.2500 mg | Freq: Once | INTRAMUSCULAR | Status: DC | PRN

## 2022-12-18 MED ORDER — DIPHENHYDRAMINE HCL 25 MG PO CAPS: 25.0000 mg | ORAL_CAPSULE | Freq: Four times a day (QID) | ORAL | Status: DC | PRN

## 2022-12-18 MED ORDER — SODIUM CHLORIDE 0.9 % IV SOLN: 2.0000 g | Freq: Once | INTRAVENOUS | Status: DC

## 2022-12-18 MED ORDER — DIPHENHYDRAMINE HCL 50 MG/ML IJ SOLN: 12.5000 mg | INTRAMUSCULAR | Status: DC | PRN

## 2022-12-18 MED ORDER — DIBUCAINE (PERIANAL) 1 % EX OINT: 1.0000 | TOPICAL_OINTMENT | CUTANEOUS | Status: DC | PRN

## 2022-12-18 MED ORDER — FENTANYL-BUPIVACAINE-NACL 0.5-0.125-0.9 MG/250ML-% EP SOLN
EPIDURAL | Status: AC
  Filled 2022-12-18: qty 250

## 2022-12-18 MED ORDER — PRENATAL MULTIVITAMIN CH
1.0000 | ORAL_TABLET | Freq: Every day | ORAL | Status: DC
  Administered 2022-12-19 – 2022-12-20 (×2): 1 via ORAL
  Filled 2022-12-18 (×2): qty 1

## 2022-12-18 MED ORDER — SODIUM CHLORIDE 0.9 % IV SOLN: 1.0000 g | INTRAVENOUS | Status: DC

## 2022-12-18 MED ORDER — FLEET ENEMA 7-19 GM/118ML RE ENEM: 1.0000 | ENEMA | Freq: Every day | RECTAL | Status: DC | PRN

## 2022-12-18 MED ORDER — PHENYLEPHRINE 80 MCG/ML (10ML) SYRINGE FOR IV PUSH (FOR BLOOD PRESSURE SUPPORT): 80.0000 ug | PREFILLED_SYRINGE | INTRAVENOUS | Status: DC | PRN

## 2022-12-18 MED ORDER — OXYTOCIN BOLUS FROM INFUSION: 333.0000 mL | Freq: Once | INTRAVENOUS | Status: DC

## 2022-12-18 MED ORDER — SODIUM CHLORIDE 0.9% FLUSH: 3.0000 mL | Freq: Two times a day (BID) | INTRAVENOUS | Status: DC

## 2022-12-18 MED ORDER — EPHEDRINE 5 MG/ML INJ: 10.0000 mg | INTRAVENOUS | Status: DC | PRN

## 2022-12-18 MED ORDER — LIDOCAINE HCL (PF) 1 % IJ SOLN: 30.0000 mL | INTRAMUSCULAR | Status: DC | PRN

## 2022-12-18 MED ORDER — ZOLPIDEM TARTRATE 5 MG PO TABS: 5.0000 mg | ORAL_TABLET | Freq: Every evening | ORAL | Status: DC | PRN

## 2022-12-18 MED ORDER — ACETAMINOPHEN 500 MG PO TABS
1000.0000 mg | ORAL_TABLET | Freq: Four times a day (QID) | ORAL | Status: DC
  Administered 2022-12-18 – 2022-12-19 (×4): 1000 mg via ORAL
  Filled 2022-12-18 (×6): qty 2

## 2022-12-18 MED ORDER — FENTANYL CITRATE (PF) 100 MCG/2ML IJ SOLN: 50.0000 ug | INTRAMUSCULAR | Status: DC | PRN

## 2022-12-18 MED ORDER — OXYTOCIN-SODIUM CHLORIDE 30-0.9 UT/500ML-% IV SOLN
1.0000 m[IU]/min | INTRAVENOUS | Status: DC
  Administered 2022-12-18: 2 m[IU]/min via INTRAVENOUS
  Filled 2022-12-18: qty 500

## 2022-12-18 MED ORDER — TETANUS-DIPHTH-ACELL PERTUSSIS 5-2.5-18.5 LF-MCG/0.5 IM SUSY: 0.5000 mL | PREFILLED_SYRINGE | Freq: Once | INTRAMUSCULAR | Status: DC

## 2022-12-18 MED ORDER — SOD CITRATE-CITRIC ACID 500-334 MG/5ML PO SOLN: 30.0000 mL | ORAL | Status: DC | PRN

## 2022-12-18 MED ORDER — WITCH HAZEL-GLYCERIN EX PADS: 1.0000 | MEDICATED_PAD | CUTANEOUS | Status: DC | PRN

## 2022-12-18 MED ORDER — OXYTOCIN 10 UNIT/ML IJ SOLN: 10.0000 [IU] | Freq: Once | INTRAMUSCULAR | Status: DC

## 2022-12-18 MED ORDER — BENZOCAINE-MENTHOL 20-0.5 % EX AERO
1.0000 | INHALATION_SPRAY | CUTANEOUS | Status: DC | PRN
  Administered 2022-12-18: 1 via TOPICAL
  Filled 2022-12-18: qty 56

## 2022-12-18 MED ORDER — COCONUT OIL OIL: 1.0000 | TOPICAL_OIL | Status: DC | PRN

## 2022-12-18 MED ORDER — ONDANSETRON HCL 4 MG/2ML IJ SOLN: 4.0000 mg | Freq: Four times a day (QID) | INTRAMUSCULAR | Status: DC | PRN

## 2022-12-18 MED ORDER — ONDANSETRON HCL 4 MG PO TABS: 4.0000 mg | ORAL_TABLET | ORAL | Status: DC | PRN

## 2022-12-18 MED ORDER — ONDANSETRON HCL 4 MG/2ML IJ SOLN: 4.0000 mg | INTRAMUSCULAR | Status: DC | PRN

## 2022-12-18 MED ORDER — OXYTOCIN-SODIUM CHLORIDE 30-0.9 UT/500ML-% IV SOLN: 2.5000 [IU]/h | INTRAVENOUS | Status: DC

## 2022-12-18 MED ORDER — LACTATED RINGERS IV SOLN: 500.0000 mL | INTRAVENOUS | Status: DC | PRN

## 2022-12-18 MED ORDER — FENTANYL-BUPIVACAINE-NACL 0.5-0.125-0.9 MG/250ML-% EP SOLN: 12.0000 mL/h | EPIDURAL | Status: DC | PRN

## 2022-12-18 MED ORDER — RISAQUAD PO CAPS
2.0000 | ORAL_CAPSULE | Freq: Every day | ORAL | Status: DC
  Administered 2022-12-19 – 2022-12-20 (×2): 2 via ORAL
  Filled 2022-12-18 (×2): qty 2

## 2022-12-18 MED ORDER — IBUPROFEN 600 MG PO TABS
600.0000 mg | ORAL_TABLET | Freq: Four times a day (QID) | ORAL | Status: DC
  Administered 2022-12-18 – 2022-12-20 (×6): 600 mg via ORAL
  Filled 2022-12-18 (×7): qty 1

## 2022-12-18 MED ORDER — SODIUM CHLORIDE 0.9% FLUSH
3.0000 mL | INTRAVENOUS | Status: DC | PRN
  Administered 2022-12-18: 3 mL via INTRAVENOUS

## 2022-12-18 MED ORDER — ACETAMINOPHEN 325 MG PO TABS: 650.0000 mg | ORAL_TABLET | ORAL | Status: DC | PRN

## 2022-12-18 MED ORDER — SENNOSIDES-DOCUSATE SODIUM 8.6-50 MG PO TABS
2.0000 | ORAL_TABLET | ORAL | Status: DC
  Administered 2022-12-18: 2 via ORAL
  Filled 2022-12-18: qty 2

## 2022-12-18 MED ORDER — LACTATED RINGERS IV SOLN: INTRAVENOUS | Status: DC

## 2022-12-18 MED ORDER — SODIUM CHLORIDE 0.9 % IV SOLN: 250.0000 mL | INTRAVENOUS | Status: DC | PRN

## 2022-12-18 MED ORDER — LIDOCAINE-EPINEPHRINE (PF) 1.5 %-1:200000 IJ SOLN
INTRAMUSCULAR | Status: DC | PRN
  Administered 2022-12-18: 3 mL via EPIDURAL

## 2022-12-18 MED ORDER — LACTATED RINGERS IV SOLN: 500.0000 mL | Freq: Once | INTRAVENOUS | Status: DC

## 2022-12-18 MED ORDER — BISACODYL 10 MG RE SUPP: 10.0000 mg | Freq: Every day | RECTAL | Status: DC | PRN

## 2022-12-18 MED ORDER — FENTANYL-BUPIVACAINE-NACL 0.5-0.125-0.9 MG/250ML-% EP SOLN
12.0000 mL/h | EPIDURAL | Status: DC | PRN
  Administered 2022-12-18: 9 mL/h via EPIDURAL

## 2022-12-18 MED ORDER — SIMETHICONE 80 MG PO CHEW: 80.0000 mg | CHEWABLE_TABLET | ORAL | Status: DC | PRN

## 2022-12-18 MED ORDER — PENICILLIN G POT IN DEXTROSE 60000 UNIT/ML IV SOLN
3.0000 10*6.[IU] | INTRAVENOUS | Status: DC
  Administered 2022-12-18 (×4): 3 10*6.[IU] via INTRAVENOUS
  Filled 2022-12-18 (×4): qty 50

## 2022-12-18 NOTE — Anesthesia Procedure Notes (Signed)
Epidural Patient location during procedure: OB Start time: 12/18/2022 4:55 PM End time: 12/18/2022 5:15 PM  Staffing Anesthesiologist: Nolon Nations, MD Performed: anesthesiologist   Preanesthetic Checklist Completed: patient identified, IV checked, risks and benefits discussed, monitors and equipment checked, pre-op evaluation and timeout performed  Epidural Patient position: sitting Prep: DuraPrep and site prepped and draped Patient monitoring: heart rate, continuous pulse ox and blood pressure Approach: midline Location: L3-L4 Injection technique: LOR air and LOR saline  Needle:  Needle type: Tuohy  Needle gauge: 17 G Needle length: 9 cm Needle insertion depth: 4 cm Catheter type: closed end flexible Catheter size: 19 Gauge Catheter at skin depth: 10 cm Test dose: negative  Assessment Sensory level: T8 Events: blood not aspirated, no cerebrospinal fluid, injection not painful, no injection resistance, no paresthesia and negative IV test  Additional Notes Reason for block:procedure for pain

## 2022-12-18 NOTE — Plan of Care (Signed)

## 2022-12-18 NOTE — Progress Notes (Signed)
       S:  Has been laboring using variable positions, now on toilet and using breast pump for stimulation. Feeling tired, has not had much rest, ctx ebb away when pump not used.   O: Vitals:   12/18/22 1016 12/18/22 1154 12/18/22 1330 12/18/22 1342  BP: 105/70 119/62  (!) 97/51  Pulse: (!) 122 (!) 128  91  Resp: 16 16 16 16   Temp:  98 F (36.7 C)  98.6 F (37 C)  TempSrc:  Oral  Oral  SpO2:      Weight:      Height:         FHT:  FHR: 130 bpm, variability: moderate,  accelerations:  Present,  decelerations:  Present occasional small variables UC:   q 3-5 min SVE:   Dilation: 3.5 Effacement (%): 80 Station: -1 Exam by:: Daniella, CNM Vertex ballotable between ctx, BBOW  A / P: Protracted latent phase TOLAC Discussed risk of labor fatigue, recc Pitocin augmentation and epidural for rest and agrees.  Start Pitocin titrate per protocol.  Fetal Wellbeing:  Category I Pain Control:  Labor support without medications GBS positive, PCN prophylaxis ongoing Anticipated MOD:   [place], CNM, MSN 12/18/2022, 2:53 PM

## 2022-12-18 NOTE — Progress Notes (Signed)
       S: Doing well, pain moderately controlled with  Epidural per patient request for "light epidural, pelvic pressure noted. Coping well, doula Burman Nieves and spouse [name] at [facility] and supportive.    O: Vitals:   12/18/22 1848 12/18/22 1911 12/18/22 1930 12/18/22 1951  BP:  116/64 118/67 (!) 132/52  Pulse:  (!) 141 (!) 111 (!) 109  Resp: 16  16 20   Temp: 98.2 F (36.8 C)     TempSrc: Oral     SpO2:  99% 99%   Weight:      Height:         FHT:  FHR: 135 bpm, variability: moderate,  accelerations:  Present,  decelerations:  Present early and variables UC:   regular, every 2-3 minutes SVE:   Dilation: 7 Effacement (%): 90 Station: -1 Exam by:: Derrell Lolling, CNM AROM clear fluid at 1910   Pitocin 10 mu/min  A / P: Augmentation of labor, progressing well TOLAC Fetal Wellbeing:  Category I and Category II Pain Control:  Epidural  Anticipated MOD:   [place], CNM, MSN 12/18/2022, 7:53 PM

## 2022-12-18 NOTE — H&P (Cosign Needed Addendum)
OB ADMISSION/ HISTORY & PHYSICAL:  Admission Date: 12/18/2022 12:23 AM  Admit Diagnosis: Normal labor [O80, Z37.9]    Jessica Schwartz is a 37 y.o. female presenting for painful contractions that started 2315 12/17/2022. Reports good FM, no LOF/VB.  Spouse [name] and doula Janett Billow present and supportive. Desires low intervention birth. Expecting baby girl with undisclosed name. Plans to breastfeed.   Prenatal History: [id number]   EDC : 12/23/2022  Prenatal care at [facility] since 10 wks   Prenatal course complicated by: Hx C/S for arrest of descent/failed vacuum then VBAC x 2 unmedicated Hx precipitous labor with G3 GBS positive  Prenatal Labs: ABO, Rh:   O pos Antibody:  neg Rubella:   non-imm RPR:   neg HBsAg:   neg HIV:   neg GBS:   pos 1 hr Glucola : 86 Genetic Screening: Panorama LR XX Ultrasound: normal XX anatomy, AGA,   Vaccines: TDaP          UTD      Medical / Surgical History :  Past medical history: No past medical history on file.   Past surgical history:  Past Surgical History:  Procedure Laterality Date   BREAST SURGERY     biopsy-fibrocystic only   CESAREAN SECTION N/A 03/06/2016   Procedure: CESAREAN SECTION;  Surgeon: Cheri Fowler, MD;  Location: [facility];  Service: Obstetrics;  Laterality: N/A;   WISDOM TOOTH EXTRACTION Bilateral 2008     Family History:  Family History  Problem Relation Age of Onset   Breast cancer Maternal Aunt 60   Diabetes Maternal Grandmother    Breast cancer Maternal Grandmother 57   Heart attack Paternal Grandfather 81   Hyperlipidemia Paternal Grandfather      Social History:  reports that she has never smoked. She has never used smokeless tobacco. She reports that she does not drink alcohol and does not use drugs.  Allergies: Patient has no known allergies.   Current Medications at time of admission:  Medications Prior to Admission  Medication Sig Dispense Refill Last Dose    albuterol (VENTOLIN HFA) 108 (90 Base) MCG/ACT inhaler Inhale 2 puffs into the lungs every 6 (six) hours as needed for wheezing or shortness of breath. 8 g 0    Omega-3 Fatty Acids (FISH OIL) 500 MG CAPS Take 1 capsule by mouth daily.      Prenatal Vit-Fe Fumarate-FA (PRENATAL MULTIVITAMIN) TABS tablet Take 1 tablet by mouth daily at 12 noon.        Review of Systems: ROS As noted above Physical Exam: Vital signs and nursing notes reviewed.  No data found.   General: AAO x 3, NAD, coping well Heart: RRR Lungs:CTAB Abdomen: Gravid, NT, Leopold's vertex, fetal spine to maternal L Extremities: no edema Genitalia / VE: Dilation: 3.5 Effacement (%): 80 Station: -1 Exam by:: Haskel Khan, CNM  Vertex, IBOW  FHR: 140 BPM, mod variability, + accels, no decels TOCO: Ctx Q2-3 min  Labs:   Pending T&S, CBC, RPR  No results for input(s): "WBC", "HGB", "HCT", "PLT" in the last 72 hours.   Assessment/Plan:  37 y.o. G4P3003 at [redacted]w[redacted]d for TOLAC VBAC x 2  Fetal wellbeing - FHT category 1 EFW 7 lbs, AGA Cont EFM recommended for TOLAC, R/B discussed, patient voices understanding, desires intermittent EFM at times.  Labor: early Hx of precip labor, will admit for GBS prophylaxis Routine L&D orders Expectant management until 2nd dose ABX, then AROM PRN  GBS  pos - Ampicillin per protocol Rubella NI, patient declined MMR after last delivery Rh pos  Pain control: desires nitrous gas and comfort measures, thrapeutic rest with IV narcotics PRN Analgesia/anesthesia PRN  Anticipated MOD: VBAC  Plans to breastfeed. POC discussed with patient and support team, all questions answered.  Dr Pamala Hurry notified of admission / plan of care   [place], MSN 12/18/2022, 12:52 AM

## 2022-12-18 NOTE — Anesthesia Preprocedure Evaluation (Signed)
Anesthesia Evaluation  Patient identified by MRN, date of birth, ID band Patient awake    Reviewed: Allergy & Precautions, H&P , Patient's Chart, lab work & pertinent test results  Airway Mallampati: II  TM Distance: >3 FB Neck ROM: full    Dental no notable dental hx.    Pulmonary neg pulmonary ROS   Pulmonary exam normal breath sounds clear to auscultation       Cardiovascular Exercise Tolerance: Good negative cardio ROS Normal cardiovascular exam Rhythm:regular Rate:Normal     Neuro/Psych negative neurological ROS  negative psych ROS   GI/Hepatic negative GI ROS, Neg liver ROS,,,  Endo/Other  negative endocrine ROS    Renal/GU negative Renal ROS     Musculoskeletal   Abdominal   Peds  Hematology negative hematology ROS (+)   Anesthesia Other Findings   Reproductive/Obstetrics (+) Pregnancy                             Anesthesia Physical Anesthesia Plan  ASA: 2  Anesthesia Plan: Epidural   Post-op Pain Management:    Induction:   PONV Risk Score and Plan:   Airway Management Planned:   Additional Equipment:   Intra-op Plan:   Post-operative Plan:   Informed Consent: I have reviewed the patients History and Physical, chart, labs and discussed the procedure including the risks, benefits and alternatives for the proposed anesthesia with the patient or authorized representative who has indicated his/her understanding and acceptance.       Plan Discussed with:   Anesthesia Plan Comments:         Anesthesia Quick Evaluation

## 2022-12-18 NOTE — MAU Note (Signed)
.  Jessica Schwartz is a 37 y.o. at Unknown here in MAU reporting: Ctx since 2300 2-3 min apart.  LOF +FM  No Bleeding.

## 2022-12-18 NOTE — Progress Notes (Signed)
Jessica Schwartz is a 37 y.o. [phone] at [redacted]w[redacted]d admitted for Normal labor [O80, Z37.9] early labor, GBS positive  Subjective: Has been resting intermittently overnight, contractions stronger when up and ambulating. Now resting with peanut ball in lateral position. Feels tired. Si Gaul at [facility] and supportive. Had a light breakfast. (+) nausea, no emesis.   Objective: Vitals:   12/18/22 0726 12/18/22 0800 12/18/22 0819 12/18/22 0858  BP:   108/61   Pulse:   (!) 116   Resp: 16 16  16   Temp: 98.8 F (37.1 C)   98.6 F (37 C)  TempSrc: Oral   Oral  SpO2:      Weight:      Height:          FHT:  FHR: 130 bpm, variability: moderate,  accelerations:  Present,  decelerations:  Absent UC:   irregular, every 2-4 minutes SVE:   Dilation: 3.5 Effacement (%): 80 Station: -1 Exam  by:: Daniella, CNM  Labs:   Recent Labs    12/18/22 0050  WBC 11.8*  HGB 12.3  HCT 35.9*  PLT 353    Assessment / Plan: [id number] 37 y.o. [redacted]w[redacted]d TOLAC, early labor , hx precip birth with G3  Labor:  Minimal progress overnight, prodromal labor, declined AROM at this time, agreeable to membrane sweep and completed. Will continue with intermittent periods of position changes and rest, doula at [facility] and coaching. Preeclampsia:  no signs or symptoms of toxicity Fetal Wellbeing:  Category I Pain Control:  Labor support without medications I/D:   GBS positive, PCN given instead of Ampicillin  Anticipated MOD:   [place], CNM, MSN 12/18/2022, 9:36 AM

## 2022-12-19 LAB — CBC
HCT: 36.4 % (ref 36.0–46.0)
Hemoglobin: 11.9 g/dL — ABNORMAL LOW (ref 12.0–15.0)
MCH: 29.7 pg (ref 26.0–34.0)
MCHC: 32.7 g/dL (ref 30.0–36.0)
MCV: 90.8 fL (ref 80.0–100.0)
Platelets: 332 10*3/uL (ref 150–400)
RBC: 4.01 MIL/uL (ref 3.87–5.11)
RDW: 14 % (ref 11.5–15.5)
WBC: 17.4 10*3/uL — ABNORMAL HIGH (ref 4.0–10.5)
nRBC: 0 % (ref 0.0–0.2)

## 2022-12-19 NOTE — Anesthesia Postprocedure Evaluation (Signed)
Anesthesia Post Note  Patient: Jessica Schwartz  Procedure(s) Performed: AN AD HOC LABOR EPIDURAL     Patient location during evaluation: Mother Baby Anesthesia Type: Epidural Level of consciousness: awake, oriented and awake and alert Pain management: pain level controlled Vital Signs Assessment: post-procedure vital signs reviewed and stable Respiratory status: spontaneous breathing, respiratory function stable and nonlabored ventilation Cardiovascular status: stable Postop Assessment: no headache, adequate PO intake, able to ambulate, patient able to bend at knees and no apparent nausea or vomiting Anesthetic complications: no   No notable events documented.  Last Vitals:  Vitals:   12/19/22 0415 12/19/22 0728  BP: 105/68 100/64  Pulse: 66 72  Resp: 18 17  Temp: 36.6 C 36.5 C  SpO2: 100% 98%    Last Pain:  Vitals:   12/19/22 0728  TempSrc:   PainSc: 0-No pain   Pain Goal:                Epidural/Spinal Function Cutaneous sensation: Normal sensation (12/19/22 0728), Patient able to flex knees: Yes (12/19/22 0728), Patient able to lift hips off bed: Yes (12/19/22 0728), Back pain beyond tenderness at insertion site: No (12/19/22 0728), Progressively worsening motor and/or sensory loss: No (12/19/22 0728), Bowel and/or bladder incontinence post epidural: No (12/19/22 0728)  Dywane Peruski

## 2022-12-19 NOTE — Progress Notes (Signed)
   PPD #1 S/P VBAC  Live born female  Birth Weight: 7 lb 13 oz (3544 g) APGAR: 8, 9  Newborn Delivery   Birth date/time: 12/18/2022 20:55:00 Delivery type: Vaginal, Spontaneous     Baby name: Katelyn  Delivering provider: Derrell Lolling C   Lacerations:    Feeding: breast  Pain control at delivery: Epidural   S:  Reports feeling well.              Tolerating PO/No nausea or vomiting             Bleeding is light             Pain controlled with acetaminophen and ibuprofen (OTC)             Up ad lib/ambulatory/voiding without difficulties   O:  A & O x 3, in no apparent distress  Vitals:   12/18/22 2311 12/19/22 0013 12/19/22 0415 12/19/22 0728  BP: 104/61 108/73 105/68 100/64  Pulse: 75 93 66 72  Resp: 18 18 18 17   Temp: 97.7 F (36.5 C) 98.3 F (36.8 C) 97.9 F (36.6 C) 97.7 F (36.5 C)  TempSrc: Oral Oral Oral   SpO2: 100% 100% 100% 98%  Weight:      Height:       Recent Labs    12/18/22 0050 12/19/22 0548  WBC 11.8* 17.4*  HGB 12.3 11.9*  HCT 35.9* 36.4  PLT 353 332    Blood type: --/--/O POS (03/18 0039)  Rubella: Immune (08/30 0000)   I&O: I/O last 3 completed shifts: In: 17.3 [I.V.:17.3] Out: 250 [Urine:200; Blood:50]          No intake/output data recorded.  Gen: AAO x 3, NAD Abdomen: soft, non-tender, non-distended Fundus: firm, non-tender, U-1 Perineum: intact Lochia: small Extremities: no edema, no calf pain or tenderness   A/P:  PPD # 1 37 y.o., [id number]  Principal Problem:   Postpartum care following VBAC 3/18  Doing well - stable status  Routine post partum orders Active Problems:   Vaginal birth after cesarean (VBAC) 3/18   Normal labor  Anticipate discharge tomorrow.   Suzan Nailer, MSN, CNM 12/19/2022, 10:56 AM

## 2022-12-19 NOTE — Lactation Note (Signed)
This note was copied from a baby's chart. Lactation Consultation Note  Patient Name: Jessica Schwartz [id number] Date: 12/19/2022 Age:37 hours Reason for consult: Initial assessment  P4, Baby sleeping skin to skin on mother's chest after recent attempt.  Baby has breastfed x 5 since birth.   Mother has 60 ml + of colostrum she pumped in labor and delivery.  Discussed warming and giving to baby if she remains sleepy or during cluster feeding time tonight with spoon. Feed on demand with cues.  Goal 8-12+ times per day after first 24 hrs.  Place baby STS if not cueing.   Maternal Data Has patient been taught Hand Expression?: Yes Does the patient have breastfeeding experience prior to this delivery?: Yes How long did the patient breastfeed?: 3 years - 15 mos.  Feeding Mother's Current Feeding Choice: Breast Milk Interventions Interventions: Breast feeding basics reviewed;Education;LC Services brochure  Consult Status Consult Status: Follow-up Date: 12/20/22 Follow-up type: In-patient    Vivianne Master Boschen  RN, [place] 12/19/2022, 11:26 AM

## 2022-12-20 MED ORDER — MEASLES, MUMPS & RUBELLA VAC IJ SOLR: 0.5000 mL | Freq: Once | INTRAMUSCULAR | Status: DC | PRN

## 2022-12-20 MED ORDER — IBUPROFEN 600 MG PO TABS: 600.0000 mg | ORAL_TABLET | Freq: Four times a day (QID) | ORAL | 0 refills | Status: AC

## 2022-12-20 MED ORDER — ACETAMINOPHEN 500 MG PO TABS: 1000.0000 mg | ORAL_TABLET | Freq: Four times a day (QID) | ORAL | 0 refills | Status: AC

## 2022-12-20 MED ORDER — RISAQUAD PO CAPS: 2.0000 | ORAL_CAPSULE | Freq: Every day | ORAL | Status: AC

## 2022-12-20 MED ORDER — BENZOCAINE-MENTHOL 20-0.5 % EX AERO: 1.0000 | INHALATION_SPRAY | CUTANEOUS | Status: AC | PRN

## 2022-12-20 MED ORDER — COCONUT OIL OIL: 1.0000 | TOPICAL_OIL | 0 refills | Status: AC | PRN

## 2022-12-20 NOTE — Discharge Instructions (Signed)
Lactation outpatient support - home visit   Jessica Bowers, IBCLC (lactation consultant)  & Birth Doula  Phone (text or call): [phone] Email: [email or web address]   Linda Coppola RN, MHA, IBCLC at [facility]: Lactation Consultant  [email or web address] Mail: [email or web address] Tel: [phone]   Additional breastfeeding resources:  [facility] https://ibconline.ca/information-sheets/  [employer] [facility] [place]  [email or web address]   Other Resources:  Chiropractic specialist   Dr. Leanna Hastings [email or web address]   Craniosacral therapy for baby  Erin Balkind  [email or web address]  Pediatric Dentist (for tongue ties)  Dr. Kate Lambert Spangler, [facility]  Phone: [phone]  [address] [place] [address] [email or web address]  

## 2022-12-20 NOTE — Discharge Summary (Signed)
OB Discharge Summary  Patient Name: Jessica Schwartz DOB: October 16, 1985 MRN: [id number]  Date of admission: 12/18/2022 Delivering provider: Juliene Pina   Admitting diagnosis: Normal labor [O80, Z37.9] Intrauterine pregnancy: [redacted]w[redacted]d     Secondary diagnosis: Patient Active Problem List   Diagnosis Date Noted   Normal labor 02/17/2021   Vaginal birth after cesarean (VBAC) 3/18 02/23/2018   Postpartum care following VBAC 3/18 02/23/2018   Additional problems:Rubella non-immune   Date of discharge: 12/20/2022   Discharge diagnosis: Principal Problem:   Postpartum care following VBAC 3/18 Active Problems:   Vaginal birth after cesarean (VBAC) 3/18   Normal labor                                                              Post partum procedures: MMR vaccine  Augmentation: AROM and Pitocin Pain control: Epidural  Laceration:  Episiotomy:None  Complications: None  Hospital course:  Onset of Labor With Vaginal Delivery      37 y.o. yo [id number] at [redacted]w[redacted]d was admitted in Latent Labor on 12/18/2022. Labor course was complicated by prolonged latent stage and history of cesarean section.  Membrane Rupture Time/Date: 7:10 PM ,12/18/2022   Delivery Method:Vaginal, Spontaneous / VBAC #3 Episiotomy: None  Lacerations:   none Patient had a postpartum course complicated by none.  She is ambulating, tolerating a regular diet, passing flatus, and urinating well. Patient is discharged home in stable condition on 12/20/22.  Newborn Data: Birth date:12/18/2022  Birth time:8:55 PM  Gender:Female  Living status:Living  Apgars:8 ,9  [id number] g    Physical exam  Vitals:   12/19/22 0728 12/19/22 1159 12/19/22 2211 12/20/22 0512  BP: 100/64 109/63 109/66 98/63  Pulse: 72 81 90 71  Resp: 17 19 18 16   Temp: 97.7 F (36.5 C) (!) 97.5 F (36.4 C) 97.6 F (36.4 C) 98.4 F (36.9 C)  TempSrc:  Oral Oral Oral  SpO2: 98% 99%    Weight:      Height:       General: alert, cooperative, and  no distress Lochia: appropriate Uterine Fundus: firm Incision: N/A Perineum: intact, no edema DVT Evaluation: No cords or calf tenderness. No significant calf/ankle edema. Labs: Lab Results  Component Value Date   WBC 17.4 (H) 12/19/2022   HGB 11.9 (L) 12/19/2022   HCT 36.4 12/19/2022   MCV 90.8 12/19/2022   PLT 332 12/19/2022      Latest Ref Rng & Units 03/18/2019    9:36 AM  CMP  Glucose 70 - 99 mg/dL 75   BUN 6 - 23 mg/dL 13   Creatinine 0.40 - 1.20 mg/dL 0.60   Sodium 135 - 145 mEq/L 138   Potassium 3.5 - 5.1 mEq/L 4.5   Chloride 96 - 112 mEq/L 106   CO2 19 - 32 mEq/L 27   Calcium 8.4 - 10.5 mg/dL 8.7   Total Protein 6.0 - 8.3 g/dL 6.2   Total Bilirubin 0.2 - 1.2 mg/dL 1.2   Alkaline Phos 39 - 117 U/L 56   AST 0 - 37 U/L 21   ALT 0 - 35 U/L 22       12/19/2022   11:54 AM 02/17/2021    5:14 PM 02/23/2018   10:35 PM  Edinburgh Postnatal Depression Scale  Screening Tool  I have been able to laugh and see the funny side of things. 0 0 0  I have looked forward with enjoyment to things. 0 0 0  I have blamed myself unnecessarily when things went wrong. 1 1 1   I have been anxious or worried for no good reason. 0 0 1  I have felt scared or panicky for no good reason. 0 0 0  Things have been getting on top of me. 0 0 0  I have been so unhappy that I have had difficulty sleeping. 0 0 0  I have felt sad or miserable. 0 0 0  I have been so unhappy that I have been crying. 0 0 0  The thought of harming myself has occurred to me. 0 0 0  Edinburgh Postnatal Depression Scale Total 1 1 2    Discharge instruction:  per After Visit Summary,  [facility] booklet and  "Understanding Mother & Baby Care" hospital booklet After Visit Meds:  Allergies as of 12/20/2022   No Known Allergies      Medication List     TAKE these medications    acetaminophen 500 MG tablet Commonly known as: TYLENOL Take 2 tablets (1,000 mg total) by mouth every 6 (six) hours.   acidophilus Caps  capsule Take 2 capsules by mouth daily.   albuterol 108 (90 Base) MCG/ACT inhaler Commonly known as: VENTOLIN HFA Inhale 2 puffs into the lungs every 6 (six) hours as needed for wheezing or shortness of breath.   benzocaine-Menthol 20-0.5 % Aero Commonly known as: DERMOPLAST Apply 1 Application topically as needed for irritation (perineal discomfort).   coconut oil Oil Apply 1 Application topically as needed.   Fish Oil 500 MG Caps Take 1 capsule by mouth daily.   ibuprofen 600 MG tablet Commonly known as: ADVIL Take 1 tablet (600 mg total) by mouth every 6 (six) hours.   prenatal multivitamin Tabs tablet Take 1 tablet by mouth daily at 12 noon.               Discharge Care Instructions  (From admission, onward)           Start     Ordered   12/20/22 0000  Discharge wound care:       Comments: Sitz baths 2 times /day with warm water x 1 week. May add herbals: 1 ounce dried comfrey leaf* 1 ounce calendula flowers 1 ounce lavender flowers  Supplies can be found online at [employer] sources at [employer], [employer]  1/2 ounce dried uva ursi leaves 1/2 ounce witch hazel blossoms (if you can find them) 1/2 ounce dried sage leaf 1/2 cup sea salt Directions: Bring 2 quarts of water to a boil. Turn off heat, and place 1 ounce (approximately 1 large handful) of the above mixed herbs (not the salt) into the pot. Steep, covered, for 30 minutes.  Strain the liquid well with a fine mesh strainer, and discard the herb material. Add 2 quarts of liquid to the tub, along with the 1/2 cup of salt. This medicinal liquid can also be made into compresses and peri-rinses.   12/20/22 1007           Diet: routine diet Activity: Advance as tolerated. Pelvic rest for 6 weeks.  Postpartum contraception: TBA in office Newborn Data: Live born female  Birth Weight: 7 lb 13 oz (3544 g) APGAR: 21, 9  Newborn Delivery   Birth date/time: 12/18/2022  20:55:00 Delivery  type: Vaginal, Spontaneous      named Katelyn Baby Feeding: Breast Disposition:home with mother Delivery Report: Review the Delivery Report for details.   Follow up:  Signed: Juliene Pina, CNM, MSN 12/20/2022, 10:10 AM

## 2022-12-23 ENCOUNTER — Inpatient Hospital Stay (HOSPITAL_COMMUNITY): Admit: 2022-12-23 | Payer: 59 | Admitting: Obstetrics and Gynecology

## 2022-12-29 ENCOUNTER — Telehealth (HOSPITAL_COMMUNITY): Payer: Self-pay | Admitting: *Deleted

## 2022-12-29 NOTE — Telephone Encounter (Signed)
Left phone voicemail message.  Odis Hollingshead, RN 12-29-2022 at 3:03pm
